# Patient Record
Sex: Female | Born: 1975
Health system: Southern US, Community
[De-identification: ages and names within clinical notes are randomized; demographics above are authoritative.]

## PROBLEM LIST (undated history)

## (undated) DIAGNOSIS — R109 Unspecified abdominal pain: Secondary | ICD-10-CM

## (undated) DIAGNOSIS — F419 Anxiety disorder, unspecified: Secondary | ICD-10-CM

## (undated) DIAGNOSIS — K219 Gastro-esophageal reflux disease without esophagitis: Secondary | ICD-10-CM

## (undated) DIAGNOSIS — Z87442 Personal history of urinary calculi: Secondary | ICD-10-CM

## (undated) HISTORY — PX: DILATION AND CURETTAGE OF UTERUS: SHX78

## (undated) HISTORY — PX: CYSTOSCOPY W/ URETEROSCOPY W/ LITHOTRIPSY: SUR380

---

## 2001-12-30 ENCOUNTER — Encounter (INDEPENDENT_AMBULATORY_CARE_PROVIDER_SITE_OTHER): Payer: Self-pay | Admitting: *Deleted

## 2001-12-30 LAB — CONVERTED CEMR LAB

## 2002-01-06 ENCOUNTER — Other Ambulatory Visit: Admission: RE | Admit: 2002-01-06 | Discharge: 2002-01-06 | Payer: Self-pay | Admitting: Gynecology

## 2002-11-14 ENCOUNTER — Encounter: Admission: RE | Admit: 2002-11-14 | Discharge: 2002-11-14 | Payer: Self-pay | Admitting: Family Medicine

## 2003-01-24 ENCOUNTER — Other Ambulatory Visit: Admission: RE | Admit: 2003-01-24 | Discharge: 2003-01-24 | Payer: Self-pay | Admitting: Gynecology

## 2003-08-13 ENCOUNTER — Encounter: Admission: RE | Admit: 2003-08-13 | Discharge: 2003-08-13 | Payer: Self-pay | Admitting: Family Medicine

## 2003-08-13 ENCOUNTER — Ambulatory Visit (HOSPITAL_COMMUNITY): Admission: RE | Admit: 2003-08-13 | Discharge: 2003-08-13 | Payer: Self-pay | Admitting: Family Medicine

## 2003-12-25 ENCOUNTER — Encounter: Admission: RE | Admit: 2003-12-25 | Discharge: 2003-12-25 | Payer: Self-pay | Admitting: Family Medicine

## 2004-08-26 ENCOUNTER — Ambulatory Visit: Payer: Self-pay | Admitting: Family Medicine

## 2004-09-15 ENCOUNTER — Ambulatory Visit (HOSPITAL_COMMUNITY): Admission: RE | Admit: 2004-09-15 | Discharge: 2004-09-15 | Payer: Self-pay | Admitting: Obstetrics & Gynecology

## 2004-10-07 ENCOUNTER — Ambulatory Visit: Payer: Self-pay | Admitting: Family Medicine

## 2005-02-03 ENCOUNTER — Other Ambulatory Visit: Admission: RE | Admit: 2005-02-03 | Discharge: 2005-02-03 | Payer: Self-pay | Admitting: Gynecology

## 2005-06-05 IMAGING — RF DG HYSTEROGRAM
6 series · 6 of 6 positions shown · IV contrast (omnipaque)
Comparison: none

CLINICAL DATA: Infertility.  Assess tubal patency.  
 HYSTEROGRAM:
 Following sterile cleansing of the external cervical os with Betadine, hysterosalpingography was performed via placement of a 5 French hysterosalpingography catheter into the endocervical canal where it was stabilized with an end catheter balloon.  Approximately 10 cc of Omnipaque 300 was injected into the endometrial canal without difficulty.
 A normal endometrial morphology was seen. Both fallopian tubes demonstrate a normal morphology and bilateral free intraperitoneal spill was documented.  On the post-drainage film no evidence for loculation of contrast is seen to suggest the presence of peritubal or periovarian adhesions.

[Series 1: run · 1 of 1 slices shown (1 of 6)]
[im 1/1]
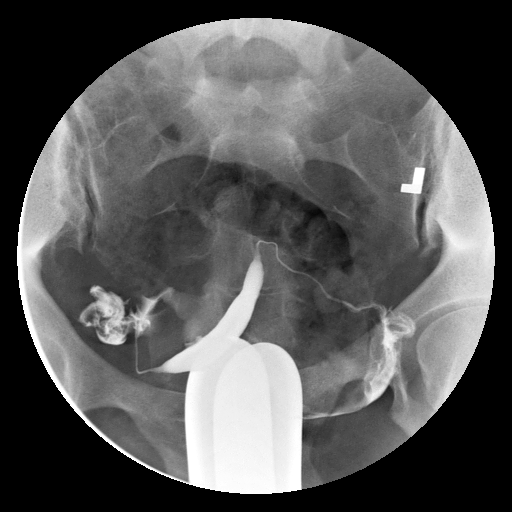

[Series 2: run · 1 of 1 slices shown (2 of 6)]
[im 1/1]
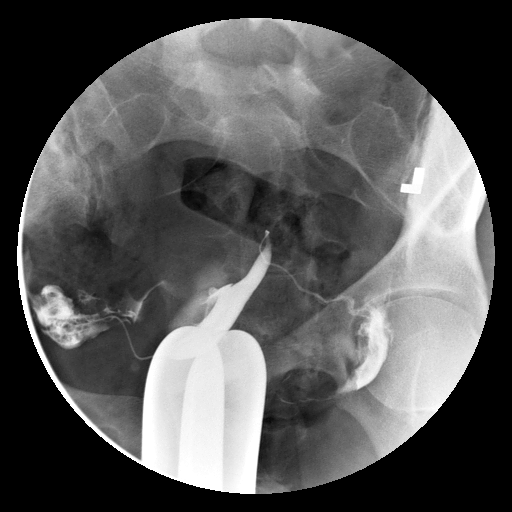

[Series 3: run · 1 of 1 slices shown (3 of 6)]
[im 1/1]
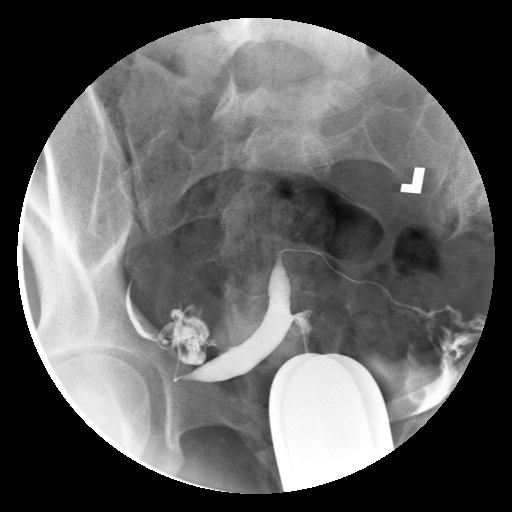

[Series 4: run · 1 of 1 slices shown (4 of 6)]
[im 1/1]
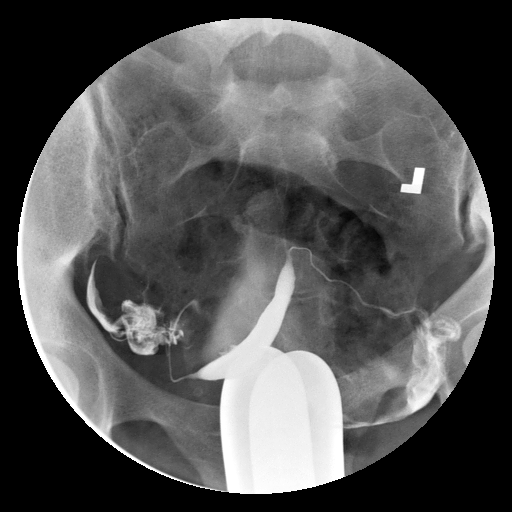

[Series 5: run · 1 of 1 slices shown (5 of 6)]
[im 1/1]
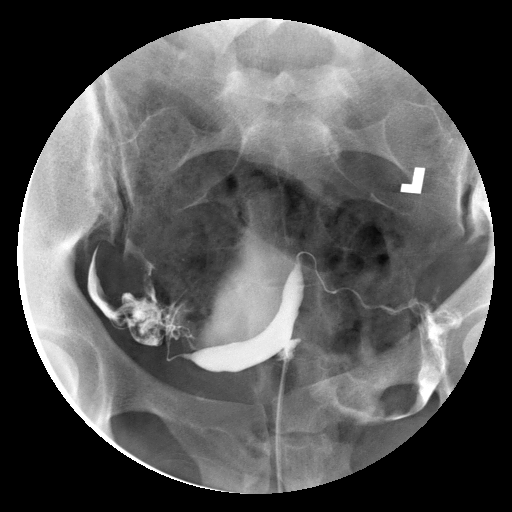

[Series 6: run · 1 of 1 slices shown (6 of 6)]
[im 1/1]
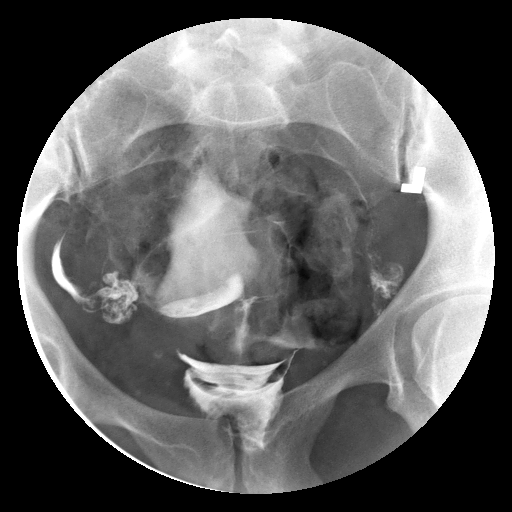

[6 of 6 positions shown; findings below may reference images not displayed]

IMPRESSION: Normal HSG.

## 2005-08-14 ENCOUNTER — Encounter (INDEPENDENT_AMBULATORY_CARE_PROVIDER_SITE_OTHER): Payer: Self-pay | Admitting: *Deleted

## 2005-08-14 ENCOUNTER — Inpatient Hospital Stay (HOSPITAL_COMMUNITY): Admission: AD | Admit: 2005-08-14 | Discharge: 2005-08-16 | Payer: Self-pay | Admitting: Gynecology

## 2005-08-17 ENCOUNTER — Encounter: Admission: RE | Admit: 2005-08-17 | Discharge: 2005-09-16 | Payer: Self-pay | Admitting: Gynecology

## 2005-09-17 ENCOUNTER — Encounter: Admission: RE | Admit: 2005-09-17 | Discharge: 2005-10-16 | Payer: Self-pay | Admitting: Gynecology

## 2005-09-24 ENCOUNTER — Other Ambulatory Visit: Admission: RE | Admit: 2005-09-24 | Discharge: 2005-09-24 | Payer: Self-pay | Admitting: Gynecology

## 2005-10-17 ENCOUNTER — Encounter: Admission: RE | Admit: 2005-10-17 | Discharge: 2005-11-16 | Payer: Self-pay | Admitting: Gynecology

## 2005-11-17 ENCOUNTER — Encounter: Admission: RE | Admit: 2005-11-17 | Discharge: 2005-12-17 | Payer: Self-pay | Admitting: Gynecology

## 2006-03-16 ENCOUNTER — Ambulatory Visit: Payer: Self-pay | Admitting: Family Medicine

## 2006-06-22 ENCOUNTER — Ambulatory Visit: Payer: Self-pay | Admitting: Family Medicine

## 2006-07-29 DIAGNOSIS — F411 Generalized anxiety disorder: Secondary | ICD-10-CM

## 2006-07-29 DIAGNOSIS — L708 Other acne: Secondary | ICD-10-CM | POA: Insufficient documentation

## 2006-07-29 DIAGNOSIS — F329 Major depressive disorder, single episode, unspecified: Secondary | ICD-10-CM

## 2006-07-29 DIAGNOSIS — F339 Major depressive disorder, recurrent, unspecified: Secondary | ICD-10-CM | POA: Insufficient documentation

## 2006-07-29 DIAGNOSIS — R000 Tachycardia, unspecified: Secondary | ICD-10-CM | POA: Insufficient documentation

## 2006-07-30 ENCOUNTER — Encounter (INDEPENDENT_AMBULATORY_CARE_PROVIDER_SITE_OTHER): Payer: Self-pay | Admitting: *Deleted

## 2006-11-12 ENCOUNTER — Telehealth: Payer: Self-pay | Admitting: Family Medicine

## 2006-12-07 ENCOUNTER — Ambulatory Visit: Payer: Self-pay | Admitting: Family Medicine

## 2006-12-07 ENCOUNTER — Encounter: Payer: Self-pay | Admitting: Family Medicine

## 2007-06-27 ENCOUNTER — Telehealth: Payer: Self-pay | Admitting: Family Medicine

## 2007-08-16 ENCOUNTER — Ambulatory Visit: Payer: Self-pay | Admitting: Family Medicine

## 2007-09-15 ENCOUNTER — Ambulatory Visit: Payer: Self-pay | Admitting: Family Medicine

## 2007-10-21 ENCOUNTER — Telehealth (INDEPENDENT_AMBULATORY_CARE_PROVIDER_SITE_OTHER): Payer: Self-pay | Admitting: Family Medicine

## 2007-11-08 ENCOUNTER — Ambulatory Visit: Payer: Self-pay | Admitting: Family Medicine

## 2007-12-01 ENCOUNTER — Telehealth: Payer: Self-pay | Admitting: *Deleted

## 2007-12-21 ENCOUNTER — Telehealth: Payer: Self-pay | Admitting: *Deleted

## 2007-12-22 ENCOUNTER — Encounter: Payer: Self-pay | Admitting: *Deleted

## 2008-02-03 ENCOUNTER — Encounter: Payer: Self-pay | Admitting: Family Medicine

## 2008-02-03 ENCOUNTER — Ambulatory Visit: Payer: Self-pay | Admitting: Family Medicine

## 2008-02-03 LAB — CONVERTED CEMR LAB
ALT: 11 units/L (ref 0–35)
AST: 13 units/L (ref 0–37)
Albumin: 4.7 g/dL (ref 3.5–5.2)
Alkaline Phosphatase: 35 units/L — ABNORMAL LOW (ref 39–117)
Cholesterol: 173 mg/dL (ref 0–200)
HCT: 42.1 % (ref 36.0–46.0)
Hemoglobin: 13.7 g/dL (ref 12.0–15.0)
MCV: 91.5 fL (ref 78.0–100.0)
Platelets: 268 10*3/uL (ref 150–400)
RBC: 4.6 M/uL (ref 3.87–5.11)
RDW: 12.8 % (ref 11.5–15.5)
Sodium: 137 meq/L (ref 135–145)
Total Bilirubin: 0.8 mg/dL (ref 0.3–1.2)
Total CHOL/HDL Ratio: 2.9
WBC: 4.4 10*3/uL (ref 4.0–10.5)

## 2008-02-24 ENCOUNTER — Encounter: Payer: Self-pay | Admitting: Family Medicine

## 2008-03-01 ENCOUNTER — Telehealth: Payer: Self-pay | Admitting: Family Medicine

## 2008-03-02 ENCOUNTER — Encounter: Payer: Self-pay | Admitting: Family Medicine

## 2008-05-04 ENCOUNTER — Ambulatory Visit: Payer: Self-pay | Admitting: Family Medicine

## 2008-06-18 ENCOUNTER — Telehealth: Payer: Self-pay | Admitting: Family Medicine

## 2008-06-29 ENCOUNTER — Ambulatory Visit: Payer: Self-pay | Admitting: Family Medicine

## 2008-07-09 ENCOUNTER — Telehealth: Payer: Self-pay | Admitting: Family Medicine

## 2008-08-07 ENCOUNTER — Ambulatory Visit: Payer: Self-pay | Admitting: Family Medicine

## 2008-08-15 ENCOUNTER — Telehealth: Payer: Self-pay | Admitting: *Deleted

## 2008-08-22 ENCOUNTER — Ambulatory Visit: Payer: Self-pay | Admitting: Family Medicine

## 2008-08-22 ENCOUNTER — Ambulatory Visit: Payer: Self-pay | Admitting: Gynecology

## 2008-08-22 ENCOUNTER — Telehealth: Payer: Self-pay | Admitting: *Deleted

## 2008-08-22 DIAGNOSIS — N920 Excessive and frequent menstruation with regular cycle: Secondary | ICD-10-CM

## 2008-08-22 LAB — CONVERTED CEMR LAB: Beta hcg, urine, semiquantitative: NEGATIVE

## 2008-09-10 ENCOUNTER — Telehealth: Payer: Self-pay | Admitting: *Deleted

## 2008-12-11 ENCOUNTER — Ambulatory Visit: Payer: Self-pay | Admitting: Family Medicine

## 2009-02-15 ENCOUNTER — Ambulatory Visit: Payer: Self-pay | Admitting: Family Medicine

## 2009-02-15 ENCOUNTER — Encounter: Payer: Self-pay | Admitting: Family Medicine

## 2009-02-15 LAB — CONVERTED CEMR LAB
HCT: 37.2 % (ref 36.0–46.0)
HDL: 58 mg/dL (ref >39)
Hemoglobin: 12.4 g/dL (ref 12.0–15.0)
LDL Cholesterol: 90 mg/dL (ref 0–99)
MCHC: 33.3 g/dL (ref 30.0–36.0)
RBC: 4.17 M/uL (ref 3.87–5.11)
RDW: 12.5 % (ref 11.5–15.5)

## 2009-02-18 ENCOUNTER — Encounter: Payer: Self-pay | Admitting: Family Medicine

## 2009-03-05 ENCOUNTER — Telehealth: Payer: Self-pay | Admitting: Family Medicine

## 2009-03-06 ENCOUNTER — Telehealth: Payer: Self-pay | Admitting: Family Medicine

## 2009-07-01 ENCOUNTER — Ambulatory Visit: Payer: Self-pay | Admitting: Family Medicine

## 2009-07-16 ENCOUNTER — Telehealth: Payer: Self-pay | Admitting: Family Medicine

## 2009-07-16 ENCOUNTER — Ambulatory Visit: Payer: Self-pay | Admitting: Family Medicine

## 2009-07-22 ENCOUNTER — Telehealth: Payer: Self-pay | Admitting: Family Medicine

## 2009-09-12 ENCOUNTER — Telehealth: Payer: Self-pay | Admitting: *Deleted

## 2009-11-28 ENCOUNTER — Encounter: Payer: Self-pay | Admitting: Family Medicine

## 2009-11-28 LAB — CONVERTED CEMR LAB
Chlamydia, DNA Probe: NEGATIVE
Hep B C IgM: NEGATIVE

## 2009-12-10 ENCOUNTER — Ambulatory Visit: Payer: Self-pay | Admitting: Family Medicine

## 2009-12-10 ENCOUNTER — Encounter: Payer: Self-pay | Admitting: Family Medicine

## 2009-12-10 DIAGNOSIS — B009 Herpesviral infection, unspecified: Secondary | ICD-10-CM

## 2009-12-10 LAB — CONVERTED CEMR LAB
Bilirubin Urine: NEGATIVE
Ketones, urine, test strip: NEGATIVE
Nitrite: NEGATIVE
Protein, U semiquant: NEGATIVE

## 2009-12-16 LAB — CONVERTED CEMR LAB
HCV Ab: NEGATIVE
Hepatitis B Surface Ag: NEGATIVE

## 2010-01-06 ENCOUNTER — Encounter: Payer: Self-pay | Admitting: Family Medicine

## 2010-01-13 ENCOUNTER — Encounter: Payer: Self-pay | Admitting: Family Medicine

## 2010-01-13 ENCOUNTER — Telehealth: Payer: Self-pay | Admitting: Family Medicine

## 2010-01-15 ENCOUNTER — Telehealth: Payer: Self-pay | Admitting: Family Medicine

## 2010-01-23 ENCOUNTER — Ambulatory Visit: Payer: Self-pay | Admitting: Family Medicine

## 2010-01-23 LAB — CONVERTED CEMR LAB: Rapid Strep: NEGATIVE

## 2010-02-07 ENCOUNTER — Telehealth: Payer: Self-pay | Admitting: Family Medicine

## 2010-04-03 ENCOUNTER — Telehealth: Payer: Self-pay | Admitting: Family Medicine

## 2010-04-14 ENCOUNTER — Encounter: Payer: Self-pay | Admitting: Family Medicine

## 2010-07-01 NOTE — Progress Notes (Signed)
Summary: phn msg  Phone Note Call from Patient Call back at Home Phone [phone]   Caller: Patient Summary of Call: needs the note to state more extensively that she cannot have a roommate Initial call taken by: De Nurse,  January 15, 2010 3:53 PM  Follow-up for Phone Call        to pcp. pt is unable to say what we should write but her company is not going to give her a private room unless the letter is more specific with what the problem is and why she must have a private room. told her will send to pcp but unlikely we will have this by 5 today. pt states she has to have this by 5. sent to pcp marked urgent Follow-up by: Golden Circle RN,  January 15, 2010 4:07 PM  Additional Follow-up for Phone Call Additional follow up Details #1::        I called pt. this day and spoke with her.  It is not necessary to outline exact nature of problem.  She was going to re-visit with HR. Additional Follow-up by: Tinnie Gens MD,  January 16, 2010 10:37 AM

## 2010-07-01 NOTE — Assessment & Plan Note (Signed)
Summary: achy,nausea/Tanya Webb/Tanya Webb   Vital Signs:  Patient profile:   35 year old female Height:      66 inches Weight:      136.8 pounds BMI:     22.16 Temp:     98.7 degrees F oral Pulse rate:   96 / minute BP sitting:   113 / 79  (left arm) Cuff size:   regular  Vitals Entered By: Garen Grams LPN (July 16, 2009 9:45 AM) CC: fatigue, nausea, dizzy x 3 days Is Patient Diabetic? No Pain Assessment Patient in pain? no        CC:  fatigue, nausea, and dizzy x 3 days.  History of Present Illness: Threw up once on Sat. pm.  No diarrhea but nausea, fatigue and dzziness come in waves.  On nothing for birth control.  Needed IUI to get pregnant last time.   Does not feel right. LMP 3 wks ago.  Negative UPT on last evening.  Next cycle due in 5 days.  Habits & Providers  Alcohol-Tobacco-Diet     Tobacco Status: never  Current Problems (verified): 1)  Nausea  (ICD-787.02) 2)  Screening Other&unspec Cardiovascular Conditions  (ICD-V81.2) 3)  Menorrhagia  (ICD-626.2) 4)  Preventive Health Care  (ICD-V70.0) 5)  Gynecologicical Exam  (ICD-V72.31) 6)  Screening For Malignant Neoplasm, Cervix  (ICD-V76.2) 7)  Tachycardia  (ICD-785.0) 8)  Depressive Disorder, Nos  (ICD-311) 9)  Anxiety  (ICD-300.00) 10)  Acne  (ICD-706.1)  Current Medications (verified): 1)  Ativan 0.5 Mg Tabs (Lorazepam) .... One Three Times A Day As Needed For Anxiety 2)  Citalopram Hydrobromide 20 Mg Tabs (Citalopram Hydrobromide) .... 1/2-1 Tab Daily  Allergies (verified): No Known Drug Allergies  Past History:  Family History: Last updated: 12-Aug-2006 COPD-Mom deceased age 40, DM, CAD-Dad deceased age 48, DM-brother  Social History: Last updated: 12-Aug-2006 Works for [employer]; No tob, alcohol, drugs; Married-1 child  Risk Factors: Alcohol Use: 0 (02/15/2009)  Risk Factors: Smoking Status: never (07/16/2009)  Review of Systems  The patient denies anorexia, weight loss, weight gain, chest  pain, syncope, peripheral edema, prolonged cough, headaches, abdominal pain, hematochezia, and severe indigestion/heartburn.    Physical Exam  General:  alert, well-developed, and well-nourished.   Head:  normocephalic and atraumatic.   Neck:  supple.   Lungs:  normal respiratory effort.   Heart:  normal rate.   Abdomen:  soft and non-tender.     Impression & Recommendations:  Problem # 1:  NAUSEA (ICD-787.02)  Orders: U Preg-[facility] (81025) [facility]- Est Level  3 ([address]109)  Problem # 2:  GENERAL COUNSELING PRESCRIPTION ORAL CONTRACEPTS (ICD-V25.01) Re-start Yaz after cycle next week.  If no period--repeat upt. Orders: [facility]- Est Level  3 ([address]454)  Complete Medication List: 1)  Ativan 0.5 Mg Tabs (Lorazepam) .... One three times a day as needed for anxiety 2)  Citalopram Hydrobromide 20 Mg Tabs (Citalopram hydrobromide) .... 1/2-1 tab daily 3)  Yaz 3-0.02 Mg Tabs (Drospirenone-ethinyl estradiol) .Marland Kitchen.. 1 by mouth daily  Patient Instructions: 1)  Please schedule a follow-up appointment as needed .  Prescriptions: YAZ 3-0.02 MG TABS (DROSPIRENONE-ETHINYL ESTRADIOL) 1 by mouth daily  #1 pack x 11   Entered and Authorized by:   Tinnie Gens MD   Signed by:   Tinnie Gens MD on 07/16/2009   Method used:   Electronically to        [facility] (retail)       [address]  [address]       [place], [place]  [address]       Ph: [phone]       Fax: [phone]   RxID:   [id number]

## 2010-07-01 NOTE — Progress Notes (Signed)
Summary: needs note  Phone Note Call from Patient Call back at Home Phone [phone]   Caller: Patient Summary of Call: needs a note stating she has a condition that she cannot share a hotel room -  fax to [phone]  Initial call taken by: De Nurse,  September 12, 2009 12:31 PM  Follow-up for Phone Call        Note written for pt. Follow-up by: Tinnie Gens MD,  September 12, 2009 3:46 PM  Additional Follow-up for Phone Call Additional follow up Details #1::        letter faxed. Additional Follow-up by: Garen Grams LPN,  September 12, 2009 5:11 PM

## 2010-07-01 NOTE — Progress Notes (Signed)
Summary: triage  Phone Note Call from Patient Call back at Home Phone [phone]   Caller: Patient Summary of Call: achy/nausea/doesn't feel good - wants to come in today Initial call taken by: De Nurse,  July 16, 2009 8:33 AM  Follow-up for Phone Call        started saturday night. took alka seltzer & ibu. double booked with her pcp. is coming from [place]. told her there may be a wait Follow-up by: Golden Circle RN,  July 16, 2009 8:35 AM

## 2010-07-01 NOTE — Letter (Signed)
Summary: Out of Work  [facility]  [address]   [place], [place] [address]   Phone: [phone]  Fax: [phone]    January 13, 2010   Patient:  TAMAIRA, CIRIELLO DOB: [birth year]   To Whom It May Concern:   This letter is to attest that Shemaiah Round is an established patient in our practice.  It is recommended that she be permitted to occupy a single-occupancy room when traveling on business due to a sleep disorder.    If you need additional information, please feel free to contact our office.         Sincerely,    Paula Compton MD

## 2010-07-01 NOTE — Progress Notes (Signed)
Summary: phn msg  Phone Note Call from Patient Call back at Home Phone [phone]   Caller: Patient Summary of Call: needs a doctors note stating about sleep disorder - needs updating and also needs dx needs asap - pls fax to [phone] Initial call taken by: De Nurse,  January 13, 2010 2:27 PM  Follow-up for Phone Call        told her pcp is not here. she has to have it now as they are making arrangments for an upcoming biz trip. she is a very light sleeper and if she has to share a room, she will wake up & have difficulty getting back to sleep. told her I will ask if another md will review her chart & agree to write the note Follow-up by: Golden Circle RN,  January 13, 2010 2:30 PM  Additional Follow-up for Phone Call Additional follow up Details #1::        pt notified that I am faxing the letter Additional Follow-up by: Golden Circle RN,  January 13, 2010 2:56 PM

## 2010-07-01 NOTE — Assessment & Plan Note (Signed)
Summary: sore throat,tcb   Vital Signs:  Patient profile:   35 year old female Height:      66 inches Weight:      145 pounds BMI:     23.49 BSA:     1.75 Temp:     98.9 degrees F Pulse rate:   98 / minute BP sitting:   125 / 82  Vitals Entered By: Jone Baseman [name] (January 23, 2010 1:50 PM) CC: sore throat x 2 weeks, URI symptoms Is Patient Diabetic? No Pain Assessment Patient in pain? yes     Location: throat Intensity: 8   Primary Care Provider:  Tinnie Gens MD  CC:  sore throat x 2 weeks and URI symptoms.  History of Present Illness:       This is a 35 year old woman who presents with URI symptoms.  The patient complains of nasal congestion, sore throat, and dry cough, but denies clear nasal discharge, purulent nasal discharge, earache, and sick contacts.  Associated symptoms include fever.  The patient denies fever to >104 degrees, stiff neck, dyspnea, and wheezing.  The patient denies itchy watery eyes, itchy throat, sneezing, seasonal symptoms, and response to antihistamine.  The patient denies the following risk factors for Strep sinusitis: unilateral facial pain, unilateral nasal discharge, double sickening, tooth pain, Strep exposure, and tender adenopathy.    Habits & Providers  Alcohol-Tobacco-Diet     Tobacco Status: never  Current Problems (verified): 1)  Sore Throat  (ICD-462) 2)  Hsv  (ICD-054.9) 3)  Contact With or Exposure To Venereal Diseases  (ICD-V01.6) 4)  Urinary Frequency  (ICD-788.41) 5)  General Counseling Prescription Oral Contracepts  (ICD-V25.01) 6)  Nausea  (ICD-787.02) 7)  Screening Other&unspec Cardiovascular Conditions  (ICD-V81.2) 8)  Menorrhagia  (ICD-626.2) 9)  Preventive Health Care  (ICD-V70.0) 10)  Gynecologicical Exam  (ICD-V72.31) 11)  Screening For Malignant Neoplasm, Cervix  (ICD-V76.2) 12)  Tachycardia  (ICD-785.0) 13)  Depressive Disorder, Nos  (ICD-311) 14)  Anxiety  (ICD-300.00) 15)  Acne  (ICD-706.1)  Current  Medications (verified): 1)  Ativan 0.5 Mg Tabs (Lorazepam) .... One Three Times A Day As Needed For Anxiety 2)  Citalopram Hydrobromide 20 Mg Tabs (Citalopram Hydrobromide) .... 1/2-1 Tab Daily 3)  Fluticasone Propionate 50 Mcg/act Susp (Fluticasone Propionate) .... 2 Sprays Ea. Nostril Daily 4)  Tetracycline Hcl 250 Mg Caps (Tetracycline Hcl) .Marland Kitchen.. 1 By Mouth Two Times A Day As Needed  Allergies (verified): No Known Drug Allergies  Past History:  Past medical, surgical, family and social histories (including risk factors) reviewed, and no changes noted (except as noted below).  Family History: Reviewed history from 07/29/2006 and no changes required. COPD-Mom deceased age 18, DM, CAD-Dad deceased age 54, DM-brother  Social History: Reviewed history from 07/29/2006 and no changes required. Works for [employer]; No tob, alcohol, drugs; Married-1 child  Review of Systems  The patient denies anorexia, weight loss, weight gain, hoarseness, chest pain, syncope, dyspnea on exertion, peripheral edema, headaches, abdominal pain, and severe indigestion/heartburn.    Physical Exam  General:  alert, well-developed, and well-nourished.   Head:  normocephalic and atraumatic.   Ears:  R ear normal and L ear normal.   Nose:  External nasal examination shows no deformity or inflammation, .mucosal erythema.   Mouth:  good dentition, pharyngeal erythema, and postnasal drip.     Impression & Recommendations:  Problem # 1:  SORE THROAT (ICD-462) Strep negative-likely URI-Trial of Flonase, warm salt water gargles. Orders: Rapid Strep-[facility] ([address])  Problem # 2:  ACNE (ICD-706.1)  Her updated medication list for this problem includes:    Tetracycline Hcl 250 Mg Caps (Tetracycline hcl) .Marland Kitchen... 1 by mouth two times a day as needed  Complete Medication List: 1)  Ativan 0.5 Mg Tabs (Lorazepam) .... One three times a day as needed for anxiety 2)  Citalopram Hydrobromide 20 Mg Tabs (Citalopram  hydrobromide) .... 1/2-1 tab daily 3)  Fluticasone Propionate 50 Mcg/act Susp (Fluticasone propionate) .... 2 sprays ea. nostril daily 4)  Tetracycline Hcl 250 Mg Caps (Tetracycline hcl) .Marland Kitchen.. 1 by mouth two times a day as needed  Patient Instructions: 1)  Please schedule a follow-up appointment in 6 months .  Prescriptions: TETRACYCLINE HCL 250 MG CAPS (TETRACYCLINE HCL) 1 by mouth two times a day as needed  #30 x 2   Entered and Authorized by:   Tinnie Gens MD   Signed by:   Tinnie Gens MD on 01/23/2010   Method used:   Electronically to        [facility] (retail)       [address]       [place], [place]  [address]       Ph: [phone]       Fax: [phone]   RxID:   [id number] FLUTICASONE PROPIONATE 50 MCG/ACT SUSP (FLUTICASONE PROPIONATE) 2 sprays ea. nostril daily  #1 x 2   Entered and Authorized by:   Tinnie Gens MD   Signed by:   Tinnie Gens MD on 01/23/2010   Method used:   Electronically to        [facility] (retail)       [address]       [place], [place]  [address]       Ph: [phone]       Fax: [phone]   RxID:   [id number]   Laboratory Results  Date/Time Received: January 23, 2010 1:50 PM  Date/Time Reported: January 23, 2010 1:58 PM   Other Tests  Rapid Strep: negative Comments: ...........test performed by...........Marland KitchenTerese Door, [name]     Appended Document: sore throat,tcb    Clinical Lists Changes  Problems: Added new problem of SORE THROAT (ICD-462) Orders: Added new Test order of [facility]- Est Level  3 ([address]) - Signed

## 2010-07-01 NOTE — Miscellaneous (Signed)
  Clinical Lists Changes  Problems: Removed problem of CONTACT WITH OR EXPOSURE TO VENEREAL DISEASES (ICD-V01.6) Removed problem of URINARY FREQUENCY (ICD-788.41) Removed problem of NAUSEA (ICD-787.02) Removed problem of SCREENING OTHER&UNSPEC CARDIOVASCULAR CONDITIONS (ICD-V81.2) Removed problem of GYNECOLOGICICAL EXAM (ICD-V72.31) Removed problem of SCREENING FOR MALIGNANT NEOPLASM, CERVIX (ICD-V76.2) Removed problem of PREVENTIVE HEALTH CARE (ICD-V70.0) Removed problem of GENERAL COUNSELING PRESCRIPTION ORAL CONTRACEPTS (ICD-V25.01)

## 2010-07-01 NOTE — Progress Notes (Signed)
Summary: triage  Phone Note Call from Patient Call back at Home Phone [phone]   Caller: Patient Summary of Call: Pt wondering if she can have an antibotic called in for an ear infection? Initial call taken by: Clydell Hakim,  July 22, 2009 10:28 AM  Follow-up for Phone Call        [place] [employer]. states she has an ear infection. adamant that she does not want to come back in. stated she was just here. explained it was not for ear & we usually must see a pt before md decides about need for antibiotic. pcp not here. asked that I ask S. Saxon about this. told her I will & will cal back with response. Follow-up by: Golden Circle RN,  July 22, 2009 11:07 AM  Additional Follow-up for Phone Call Additional follow up Details #1::        told her S. Humberto Seals also declined to give meds without a visit. stated it is our policy. told pt this again. she will e here at 1:30 for a work in. aware there may be a wait Additional Follow-up by: Golden Circle RN,  July 22, 2009 11:52 AM

## 2010-07-01 NOTE — Progress Notes (Signed)
Summary: meds prob  Phone Note Call from Patient Call back at Home Phone [phone]   Caller: Patient Summary of Call: was switched to Tetracyclen and it is not working - would like to change to [employer].  Initial call taken by: De Nurse,  April 03, 2010 3:56 PM    New/Updated Medications: MINOCYCLINE HCL 100 MG CAPS (MINOCYCLINE HCL) 1 by mouth daily Prescriptions: MINOCYCLINE HCL 100 MG CAPS (MINOCYCLINE HCL) 1 by mouth daily  #30 x 3   Entered and Authorized by:   Tinnie Gens MD   Signed by:   Tinnie Gens MD on 04/03/2010   Method used:   Electronically to        [facility] (retail)       [address]       [place], [place]  [address]       Ph: [phone]       Fax: [phone]   RxID:   [id number]

## 2010-07-01 NOTE — Assessment & Plan Note (Signed)
Summary: f/up for meds,tcb   Vital Signs:  Patient profile:   35 year old female Height:      66 inches Weight:      141 pounds BMI:     22.84 Temp:     98.6 degrees F oral Pulse rate:   97 / minute BP sitting:   112 / 77  (left arm) Cuff size:   regular  Vitals Entered By: Tessie Fass, [name] (July 01, 2009 3:28 PM) CC: F/U meds Is Patient Diabetic? No Pain Assessment Patient in pain? no        CC:  F/U meds.  History of Present Illness: Describes chronic anxiety, and pre menstral dysphoric syndrome behaviors.  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  She exercises often.  Has used SSRI medications in the past and Lexapro made her sleepy.  She often has difficulty sleeping as she falls asleep but awakens and thinks about all her struggles.  Lorazepam has been effective, last year she used a total of #60, and would like a refill.  She is very careful.  Habits & Providers  Alcohol-Tobacco-Diet     Tobacco Status: never  Current Medications (verified): 1)  Ativan 0.5 Mg Tabs (Lorazepam) .... One Three Times A Day As Needed For Anxiety 2)  Citalopram Hydrobromide 20 Mg Tabs (Citalopram Hydrobromide) .... 1/2-1 Tab Daily  Allergies (verified): No Known Drug Allergies  Review of Systems Psych:  Complains of anxiety, depression, irritability, and panic attacks; denies suicidal thoughts/plans.  Physical Exam  General:  Well-developed,well-nourished,in no acute distress; alert,appropriate and cooperative throughout examination Psych:  Oriented X3, memory intact for recent and remote, normally interactive, not depressed appearing, and slightly anxious.     Impression & Recommendations:  Problem # 1:  DEPRESSIVE DISORDER, NOS (ICD-311) Refilled lorazepam, added low dose SSRI to use as in and around her menses for one week, may use ongoing as well, she is insightful enough to use  appropriately. Her updated medication list for this problem includes:    Ativan 0.5 Mg Tabs (Lorazepam) ..... One three times a day as needed for anxiety    Citalopram Hydrobromide 20 Mg Tabs (Citalopram hydrobromide) .Marland Kitchen... 1/2-1 tab daily  Orders: [facility]- Est Level  2 ([address])  Complete Medication List: 1)  Ativan 0.5 Mg Tabs (Lorazepam) .... One three times a day as needed for anxiety 2)  Citalopram Hydrobromide 20 Mg Tabs (Citalopram hydrobromide) .... 1/2-1 tab daily  Patient Instructions: 1)  May begin 1/2 tab of citalopram as soon as you feel the anxiety,  may stop mid cycle 2)  Ativan prn Prescriptions: CITALOPRAM HYDROBROMIDE 20 MG TABS (CITALOPRAM HYDROBROMIDE) 1/2-1 tab daily Brand medically necessary #30 x 6   Entered and Authorized by:   Luretha Murphy NP   Signed by:   Luretha Murphy NP on 07/01/2009   Method used:   Print then Give to Patient   RxID:   [id number] ATIVAN 0.5 MG TABS (LORAZEPAM) one three times a day as needed for anxiety Brand medically necessary #60 x 0   Entered and Authorized by:   Luretha Murphy NP   Signed by:   Luretha Murphy NP on 07/01/2009   Method used:   Print then Give to Patient   RxID:   [id number]    Prevention & Chronic Care Immunizations   Influenza vaccine: Not documented    Tetanus booster: 02/03/2008: given   Tetanus  booster due: 02/02/2018    Pneumococcal vaccine: Not documented  Other Screening   Pap smear: NEGATIVE FOR INTRAEPITHELIAL LESIONS OR MALIGNANCY.  (02/03/2008)   Pap smear action/deferral: Deferred-2 yr interval  (02/15/2009)   Pap smear due: 12/31/2002   Smoking status: never  (07/01/2009)  Lipids   Total Cholesterol: 163  (02/15/2009)   LDL: 90  (02/15/2009)   LDL Direct: Not documented   HDL: 58  (02/15/2009)   Triglycerides: 77  (02/15/2009)

## 2010-07-01 NOTE — Assessment & Plan Note (Signed)
Summary: f/u,df   Vital Signs:  Patient profile:   35 year old female Height:      66 inches Weight:      139.5 pounds BMI:     22.60 Temp:     98.7 degrees F oral Pulse rate:   102 / minute BP sitting:   132 / 89  (left arm) Cuff size:   regular  Vitals Entered By: Gladstone Pih (December 10, 2009 9:38 AM) CC: f/u Is Patient Diabetic? No Pain Assessment Patient in pain? no        Primary Care Provider:  Tinnie Gens MD  CC:  f/u.  History of Present Illness: 35 yo here for evaluation of vaginal discomfort  STD exposure: patient very worried as she recently found out about husband's infidelity.  She is  unsure if she is just anxious or is actually feeling discomfort in her vaginal area.  Has not noticed any lesions.  No vaginal discharge, no dysuria but having  some urinary frequency.  Went to another clinic to get STD testing as she was embarrased to come here.  States that HIV, Syphillis, gonorrhea, and chlamydia tests.  Did not do speculum exam.  She is worried because blood test for HSV as positive.  Has never had an outbreak that she knows of.  Anxiety:  Has been stable on same dose of celexa for many years.  She feels like she has good support and does not need additional help with coping.  Asks for refill of Celexa today.  Allergies: No Known Drug Allergies  Review of Systems      See HPI  Physical Exam  General:  alert, well-developed, and well-nourished. Anxious appearing.  Genitalia:  a single shallow ulcer noted at 6 o clock position at base of perineum.  tender to palpation.   Impression & Recommendations:  Problem # 1:  HSV (ICD-054.9)  positive but no records to indiciate if HSV1 or 2.  Suspicious lesion cultured.  Unclear dx by physicl exam.  ddx to include hsv vs chancroid.  WIll await culture results and discuss tx options with patient.  Not painful to warrant empiric treatment- not sexually active at this time (no transmission)  Orders: [facility]- Est  Level  3 (99213)  Problem # 2:  CONTACT WITH OR EXPOSURE TO VENEREAL DISEASES (ICD-V01.6) WIll complete workup with hepatitis B and C.  Orders: Hep Bs Ab-[facility] ([id number]) Hep Bs Ag-[facility] [phone]) Hep C Ab-[facility] ([id number]) Wet Prep- [facility] (66063) Herpes Culture- [facility] (01601) [facility]- Est Level  3 (09323)  Complete Medication List: 1)  Ativan 0.5 Mg Tabs (Lorazepam) .... One three times a day as needed for anxiety 2)  Citalopram Hydrobromide 20 Mg Tabs (Citalopram hydrobromide) .... 1/2-1 tab daily 3)  Yaz 3-0.02 Mg Tabs (Drospirenone-ethinyl estradiol) .Marland Kitchen.. 1 by mouth daily  Other Orders: Urinalysis-[facility] (00000) Prescriptions: CITALOPRAM HYDROBROMIDE 20 MG TABS (CITALOPRAM HYDROBROMIDE) 1/2-1 tab daily Brand medically necessary #30 x 6   Entered and Authorized by:   Delbert Harness MD   Signed by:   Delbert Harness MD on 12/10/2009   Method used:   Electronically to        [facility] [address] (retail)       [address]       [place], [place]  [address]       Ph: [phone]       Fax: [phone]   RxID:   [id number]  Laboratory Results   Urine Tests  Date/Time Received: December 10, 2009 9:54 AM  Date/Time Reported: December 10, 2009 10:38 AM   Routine Urinalysis   Color: yellow Appearance: Clear Glucose: negative   (Normal Range: Negative) Bilirubin: negative   (Normal Range: Negative) Ketone: negative   (Normal Range: Negative) Spec. Gravity: 1.020   (Normal Range: 1.003-1.035) Blood: negative   (Normal Range: Negative) pH: 7.0   (Normal Range: 5.0-8.0) Protein: negative   (Normal Range: Negative) Urobilinogen: 0.2   (Normal Range: 0-1) Nitrite: negative   (Normal Range: Negative) Leukocyte Esterace: trace   (Normal Range: Negative)  Urine Microscopic WBC/HPF: 0-3 RBC/HPF: rare Bacteria/HPF: 2+ Mucous/HPF: 1+ Epithelial/HPF: 5-10 Other: small amorphous    Comments: ...........test performed by...........Marland KitchenTerese Door,  [name]  Date/Time Received: December 10, 2009 10:30 AM  Date/Time Reported: December 10, 2009 10:51 AM   [facility] Source: vaginal WBC/hpf: 1-5 Bacteria/hpf: 3+  Rods Clue cells/hpf: none  Negative whiff Yeast/hpf: none Trichomonas/hpf: none Comments: ...........test performed by...........Marland KitchenTerese Door, [name]

## 2010-07-01 NOTE — Progress Notes (Signed)
  Phone Note Call from Patient   Caller: Patient Call For: [phone] Summary of Call: Still congested and sorethroat from 2wks ago.  Require an antiobiotic or something else from what was prescribed.  Was seen on 8/25. Initial call taken by: Britta Mccreedy mcgregor  Follow-up for Phone Call        Called pt. and LVM about rx and returning for another office visit if no improvement in a couple of days. Follow-up by: Tinnie Gens MD,  February 10, 2010 9:04 AM    New/Updated Medications: ALLEGRA ALLERGY 180 MG TABS (FEXOFENADINE HCL) 1 by mouth daily Prescriptions: ALLEGRA ALLERGY 180 MG TABS (FEXOFENADINE HCL) 1 by mouth daily  #20 x 1   Entered and Authorized by:   Tinnie Gens MD   Signed by:   Tinnie Gens MD on 02/10/2010   Method used:   Electronically to        [facility] [address] (retail)       [address]       [place], [place]  [address]       Ph: [phone]       Fax: [phone]   RxID:   [id number]

## 2010-07-01 NOTE — Miscellaneous (Signed)
  Clinical Lists Changes  Observations: Added new observation of HERPES_POS: I and II IgG 50.2 (11/28/2009 14:34) Added new observation of HIV AB: Non-reactive (11/28/2009 14:34) Added new observation of HEP C AB: Negative (11/28/2009 14:34) Added new observation of HEP B C AB: negative (11/28/2009 14:34) Added new observation of HBSAG: negative (11/28/2009 14:34) Added new observation of RPR TITER: non-reactive (11/28/2009 14:34) Added new observation of CHLAMYD DNA: negative (11/28/2009 14:34) Added new observation of GC DNA PROBE: negative (11/28/2009 14:34)

## 2010-07-08 ENCOUNTER — Encounter: Payer: Self-pay | Admitting: *Deleted

## 2010-09-17 ENCOUNTER — Other Ambulatory Visit: Payer: Self-pay | Admitting: Family Medicine

## 2010-09-17 NOTE — Telephone Encounter (Signed)
Refill request

## 2010-11-20 ENCOUNTER — Encounter: Payer: Self-pay | Admitting: Family Medicine

## 2011-03-11 ENCOUNTER — Other Ambulatory Visit: Payer: Self-pay | Admitting: Family Medicine

## 2012-02-12 ENCOUNTER — Ambulatory Visit (INDEPENDENT_AMBULATORY_CARE_PROVIDER_SITE_OTHER): Payer: Managed Care, Other (non HMO) | Admitting: Family Medicine

## 2012-02-12 VITALS — BP 114/78 | HR 92 | Temp 98.6°F | Wt 156.0 lb

## 2012-02-12 DIAGNOSIS — IMO0002 Reserved for concepts with insufficient information to code with codable children: Secondary | ICD-10-CM

## 2012-02-12 DIAGNOSIS — S39011A Strain of muscle, fascia and tendon of abdomen, initial encounter: Secondary | ICD-10-CM

## 2012-02-12 NOTE — Progress Notes (Signed)
  Subjective:    Patient ID: Tanya Webb, female    DOB: [birth year], 36 y.o.   MRN: [id number]  HPI  Tanya Webb comes in for a knot just above her belly button that she noticed this morning.  She says it is painful.  She cannot think of any injury or change in activity.  She has had no surgeries on her abdomen.  No fevers/chills, nausea/vomiting.   Review of Systems See HPI    Objective:   Physical Exam BP 114/78  Pulse 92  Temp 98.6 F (37 C) (Oral)  Wt 156 lb (70.761 kg)  LMP 01/18/2012 General appearance: alert, cooperative and no distress Abdomen: +BS, soft, non-tender, except just above umbilicus there is a 2x3 cm palpable mass that is tender.  There is no abdominal wall defect.  Hurts worse with engaging abdominal muscles.        Assessment & Plan:

## 2012-02-12 NOTE — Assessment & Plan Note (Signed)
Reassured patient that I do not think this is anything dangerous- gave pt instructions for abdominal wall muscle strain.  Red flags for return to care reviewed.

## 2012-02-12 NOTE — Patient Instructions (Signed)
I think you have an abdominal wall muscle strain.  Try ibuprofen, 600 mg three times a day for about a week, then use as needed.  Also, you can ice the area, or try a heating pad, whichever feels better.   If you develop fever, nausea/vomiting, diarrhea, or worsening abdominal pain, please call the office or seek medical care.

## 2012-06-08 ENCOUNTER — Ambulatory Visit (INDEPENDENT_AMBULATORY_CARE_PROVIDER_SITE_OTHER): Payer: BC Managed Care – PPO | Admitting: Family Medicine

## 2012-06-08 ENCOUNTER — Encounter: Payer: Self-pay | Admitting: Family Medicine

## 2012-06-08 VITALS — BP 121/79 | HR 82 | Temp 98.6°F | Wt 155.0 lb

## 2012-06-08 DIAGNOSIS — J101 Influenza due to other identified influenza virus with other respiratory manifestations: Secondary | ICD-10-CM | POA: Insufficient documentation

## 2012-06-08 DIAGNOSIS — Z319 Encounter for procreative management, unspecified: Secondary | ICD-10-CM | POA: Insufficient documentation

## 2012-06-08 NOTE — Patient Instructions (Addendum)
Influenza A (H1N1) H1N1 formerly called "swine flu" is a new influenza virus causing sickness in people. The H1N1 virus is different from seasonal influenza viruses. However, the H1N1 symptoms are similar to seasonal influenza and it is spread from person to person. You may be at higher risk for serious problems if you have underlying serious medical conditions. The CDC and the World Health Organization are following reported cases around the world. CAUSES   The flu is thought to spread mainly person-to-person through coughing or sneezing of infected people.  A person may become infected by touching something with the virus on it and then touching their mouth or nose. SYMPTOMS   Fever.  Headache.  Tiredness.  Cough.  Sore throat.  Runny or stuffy nose.  Body aches.  Diarrhea and vomiting These symptoms are referred to as "flu-like symptoms." A lot of different illnesses, including the common cold, may have similar symptoms. DIAGNOSIS   There are tests that can tell if you have the H1N1 virus.  Confirmed cases of H1N1 will be reported to the state or local health department.  A doctor's exam may be needed to tell whether you have an infection that is a complication of the flu. HOME CARE INSTRUCTIONS   Stay informed. Visit the CDC website for current recommendations. Visit www.cdc.gov/H1N1flu/. You may also call 1-800-CDC-INFO (1-800-232-4636).  Get help early if you develop any of the above symptoms.  If you are at high risk from complications of the flu, talk to your caregiver as soon as you develop flu-like symptoms. Those at higher risk for complications include:  People 65 years or older.  People with chronic medical conditions.  Pregnant women.  Young children.  Your caregiver may recommend antiviral medicine to help treat the flu.  If you get the flu, get plenty of rest, drink enough water and fluids to keep your urine clear or pale yellow, and avoid using  alcohol or tobacco.  You may take over-the-counter medicine to relieve the symptoms of the flu if your caregiver approves. (Never give aspirin to children or teenagers who have flu-like symptoms, particularly fever). TREATMENT  If you do get sick, antiviral drugs are available. These drugs can make your illness milder and make you feel better faster. Treatment should start soon after illness starts. It is only effective if taken within the first day of becoming ill. Only your caregiver can prescribe antiviral medication.  PREVENTION   Cover your nose and mouth with a tissue or your arm when you cough or sneeze. Throw the tissue away.  Wash your hands often with soap and warm water, especially after you cough or sneeze. Alcohol-based cleaners are also effective against germs.  Avoid touching your eyes, nose or mouth. This is one way germs spread.  Try to avoid contact with sick people. Follow public health advice regarding school closures. Avoid crowds.  Stay home if you get sick. Limit contact with others to keep from infecting them. People infected with the H1N1 virus may be able to infect others anywhere from 1 day before feeling sick to 5-7 days after getting flu symptoms.  An H1N1 vaccine is available to help protect against the virus. In addition to the H1N1 vaccine, you will need to be vaccinated for seasonal influenza. The H1N1 and seasonal vaccines may be given on the same day. The CDC especially recommends the H1N1 vaccine for:  Pregnant women.  People who live with or care for children younger than 6 months of age.    Health care and emergency services personnel.  Persons between the ages of 62 months through 56 years of age.  People from ages 32 through 32 years who are at higher risk for H1N1 because of chronic health disorders or immune system problems. FACEMASKS In community and home settings, the use of facemasks and N95 respirators are not normally recommended. In certain  circumstances, a facemask or N95 respirator may be used for persons at increased risk of severe illness from influenza. Your caregiver can give additional recommendations for facemask use. IN CHILDREN, EMERGENCY WARNING SIGNS THAT NEED URGENT MEDICAL CARE:  Fast breathing or trouble breathing.  Bluish skin color.  Not drinking enough fluids.  Not waking up or not interacting normally.  Being so fussy that the child does not want to be held.  Your child has an oral temperature above 102 F (38.9 C), not controlled by medicine.  Your baby is older than 3 months with a rectal temperature of 102 F (38.9 C) or higher.  Your baby is 21 months old or younger with a rectal temperature of 100.4 F (38 C) or higher.  Flu-like symptoms improve but then return with fever and worse cough. IN ADULTS, EMERGENCY WARNING SIGNS THAT NEED URGENT MEDICAL CARE:  Difficulty breathing or shortness of breath.  Pain or pressure in the chest or abdomen.  Sudden dizziness.  Confusion.  Severe or persistent vomiting.  Bluish color.  You have a oral temperature above 102 F (38.9 C), not controlled by medicine.  Flu-like symptoms improve but return with fever and worse cough. SEEK IMMEDIATE MEDICAL CARE IF:  You or someone you know is experiencing any of the above symptoms. When you arrive at the emergency center, report that you think you have the flu. You may be asked to wear a mask and/or sit in a secluded area to protect others from getting sick. MAKE SURE YOU:   Understand these instructions.  Will watch your condition.  Will get help right away if you are not doing well or get worse. Some of this information courtesy of the CDC.  Document Released: 11/04/2007 Document Revised: 08/10/2011 Document Reviewed: 11/04/2007 [facility] Patient Information 2013 [place], [place]. Preparing for Pregnancy Preparing for pregnancy (preconceptual care) by getting counseling and information from your  caregiver before getting pregnant is a good idea. It will help you and your baby have a better chance to have a healthy, safe pregnancy and delivery of your baby. Make an appointment with your caregiver to talk about your health, medical, and family history and how to prepare yourself before getting pregnant. Your caregiver will do a complete physical exam and a Pap test. They will want to know:  About you, your spouse or partner, and your family's medical and genetic history.  If you are eating a balanced diet and drinking enough fluids.  What vitamins and mineral supplements you are taking. This includes taking folic acid before getting pregnant to help prevent birth defects.  What medications you are taking including prescription, over-the-counter and herbal medications.  If there is any substance abuse like alcohol, smoking, and illegal drugs.  If there is any mental or physical domestic violence.  If there is any risk of sexually transmitted disease between you and your partner.  What immunizations and vaccinations you have had and what you may need before getting pregnant.  If you should get tested for HIV infection.  If there is any exposure to chemical or toxic substances at home or work.  If  there are medical problems you have that need to be treated and kept under control before getting pregnant such as diabetes, high blood pressure or others.  If there were any past surgeries, pregnancies and problems with them.  What your current weight is and to set a goal as to how much weight you should gain while pregnant. Also, they will check if you should lose or gain weight before getting pregnant.  What is your exercise routine and what it is safe when you are pregnant.  If there are any physical disabilities that need to be addressed.  About spacing your pregnancies when there are other children.  If there is a financial problem that may affect you having a child. After  talking about the above points with your caregiver, your caregiver will give you advice on how to help treat and work with you on solving any issues, if necessary, before getting pregnant. The goal is to have a healthy and safe pregnancy for you and your baby. You should keep an accurate record of your menstrual periods because it will help in determining your due date. Immunizations that you should have before getting pregnant:   Regular measles, [place] measles (rubella) and mumps.  Tetanus and diphtheria.  Chickenpox, if not immune.  Herpes zoster (Varicella) if not immune.  Human papilloma virus vaccine (HPV) between the age of 44 and 78 years old.  Hepatitis A vaccine.  Hepatitis B vaccine.  Influenza vaccine.  Pneumococcal vaccine (pneumonia). You should avoid getting pregnant for one month after getting vaccinated with a live virus vaccine such as [place] measles (rubella) vaccine. Other immunizations may be necessary depending on where you live, such as malaria. Ask your caregiver if any other immunizations are needed for you. HOME CARE INSTRUCTIONS   Follow the advice of your caregiver.  Before getting pregnant:  Begin taking vitamins, supplements, and 0.4 milligrams folic acid daily.  Get your immunizations up-to-date.  Get help from a nutrition counselor if you do not understand what a balanced diet is, need help with a special medical diet or if you need help to lose or gain weight.  Begin exercising.  Stop smoking, taking illegal drugs, and drinking alcoholic beverages.  Get counseling if there is and type of domestic violence.  Get checked for sexually transmitted diseases including HIV.  Get any medical problems under control (diabetes, high blood pressure, convulsions, asthma or others).  Resolve any financial concerns.  Be sure you and your spouse or partner are ready to have a baby.  Keep an accurate record of your menstrual periods. Document Released:  04/30/2008 Document Revised: 08/10/2011 Document Reviewed: 04/30/2008 [facility] Patient Information 2013 [place], [place].

## 2012-06-08 NOTE — Assessment & Plan Note (Signed)
Discussed how flu may affect the villi and likely course and reason for cough.--tincture of time recommended.

## 2012-06-08 NOTE — Assessment & Plan Note (Signed)
On PNV's.  Preconception info given.

## 2012-06-08 NOTE — Progress Notes (Signed)
  Subjective:    Patient ID: Tanya Webb, female    DOB: 1975-08-14, 37 y.o.   MRN: [id number]  HPI  Had documented flu over the christmas holiday and has persistent cough.  No other sx's.  Denies fever/chills.  Interested in getting pregnant.  Has gotten back together with her husband.  Last pregnancy achieved with IUI.  Negative w/u.  Turns 37 this year.  Review of Systems  Constitutional: Negative for fever and chills.  HENT: Negative for congestion and rhinorrhea.   Respiratory: Positive for cough. Negative for chest tightness and shortness of breath.   Cardiovascular: Negative for leg swelling.  Gastrointestinal: Negative for nausea, abdominal pain and diarrhea.       Objective:   Physical Exam  Vitals reviewed. Constitutional: She appears well-developed and well-nourished.  HENT:  Head: Normocephalic and atraumatic.  Neck: Neck supple.  Cardiovascular: Normal rate and regular rhythm.   Pulmonary/Chest: Effort normal and breath sounds normal. No respiratory distress. She has no wheezes. She has no rales.  Abdominal: Soft. There is no tenderness.          Assessment & Plan:

## 2013-02-15 ENCOUNTER — Ambulatory Visit (INDEPENDENT_AMBULATORY_CARE_PROVIDER_SITE_OTHER): Payer: BC Managed Care – PPO | Admitting: Family Medicine

## 2013-02-15 ENCOUNTER — Encounter: Payer: Self-pay | Admitting: Family Medicine

## 2013-02-15 VITALS — BP 118/85 | HR 89 | Temp 98.3°F | Ht 67.0 in | Wt 158.0 lb

## 2013-02-15 DIAGNOSIS — J189 Pneumonia, unspecified organism: Secondary | ICD-10-CM

## 2013-02-15 MED ORDER — AZITHROMYCIN 500 MG PO TABS: 500.0000 mg | ORAL_TABLET | Freq: Every day | ORAL | Status: DC

## 2013-02-15 NOTE — Progress Notes (Signed)
Subjective:     Patient ID: Tanya Webb, female   DOB: 12-03-1975, 37 y.o.   MRN: [id number]  HPI  37 yo healthy CF here for cough.   - 10 days of cough - feels like right side of lung is tight - started with congestion and runny nose  - that is now gone - coughing up green junk - cough seems to be worsening.  - goes into fits at times. No posttussive emesis though - no fevers, chills. Chest pain, wheezing, sob. No HA, sore throat.   + Fatigue - keeps her up at night  No weight loss or hemoptysis   Review of Systems See above    Objective:   Physical Exam Filed Vitals:   02/15/13 1418  BP: 118/85  Pulse: 89  Temp: 98.3 F (36.8 C)  TempSrc: Oral  Height: 5\' 7"  (1.702 m)  Weight: 71.668 kg (158 lb)    Gen: NAD,  HEENT: oropharynx with evidence of irritation but without exudate. Nares patent bilaterally. No significant lymphadenopathy.  PULM: decreased breath sounds in right base. Crackles in right base. Other lung fields clear to auscultation without wheeze. Good effort throughout.  CV:RRR. Normal S1 and S2 EXT: good capillary refill.     Assessment:     Pneumonia      Plan:     Concerning for developing right base PNA given decreased breath sounds and crackles.  - given course, will treat with azithromycin 500mg  x 5 days for pertussis coverage also - recommend robitussin for cough OTC - advised should the tightness and phlegm not improve after abx, to call back for another appt.  - cough may linger and this can be normal - discussed that xray would likely not change management given lung findings but should she not improve, we will need to obtain an xray at that time.

## 2013-02-15 NOTE — Patient Instructions (Signed)

## 2016-02-17 ENCOUNTER — Telehealth: Payer: Self-pay | Admitting: Family Medicine

## 2016-02-17 NOTE — Telephone Encounter (Signed)
Pt had CT scan done at hospital over the weekend. She went because of umbilical hernia,. The scan showed gallstones, kidney damage and a small spot on her right lower lung.  She is very concerned.

## 2016-02-18 NOTE — Telephone Encounter (Signed)
Pt would like to know the name of a general surgeon. She would like to see the surgeon ASAP about the gallbladder and kidney issues that showed up at the scan.

## 2016-02-18 NOTE — Telephone Encounter (Signed)
Spoke with patient and she in the process of getting her notes from the hospital visit faxed to [place] and also contacting [facility] [place] [facility]. Kionte Baumgardner,[name]

## 2016-02-24 ENCOUNTER — Ambulatory Visit: Payer: Self-pay | Admitting: Surgery

## 2016-02-24 ENCOUNTER — Telehealth: Payer: Self-pay | Admitting: *Deleted

## 2016-02-24 DIAGNOSIS — N139 Obstructive and reflux uropathy, unspecified: Secondary | ICD-10-CM

## 2016-02-24 NOTE — H&P (Signed)
Tanya Webb. Siner 02/24/2016 9:32 AM Location: [facility] Patient #: [id number] DOB: November 16, 1975 Married / Language: Cleophus Molt / Race: White Female  History of Present Illness Tanya Moores A. [name] MD; 02/24/2016 12:28 PM) Patient words: GB   Patient presents for evaluation of abdominal pain. She was seen 9 days ago in the emergency room at [facility]. She's had a history of epigastric abdominal pain after eating. Greasy fatty spicy foods make it worse. Location is her upper abdomen and lower chest region. The pain is sharp associated with nausea and comes and goes. She has a history of a small umbilical hernia as well. Her pain became severe 9 days ago and she sought care initially at an urgent care and then subsequent emergency room visit. CT scan showed debris in the gallbladder consistent with either gallstones versus sludge. She also has a small umbilical hernia, a common bile duct that measured 1.3 cm with normal LFTs and right hydroureter. She comes in today to discuss her gallbladder issues and umbilical hernia. She has had no other flareups of pain like this but his had multiple milder flareups of similar pain after eating with this being the first severe episode.  The patient is a 40 year old female.   Other Problems [name], [place]; 02/24/2016 9:33 AM) Gastroesophageal Reflux Disease Inguinal Hernia  Past Surgical History [name], [place]; 02/24/2016 9:33 AM) Oral Surgery  Diagnostic Studies History [name], [place]; 02/24/2016 9:33 AM) Colonoscopy never Mammogram 1-3 years ago Pap Smear 1-5 years ago  Allergies [name], [place]; 02/24/2016 9:33 AM) No Known Drug Allergies 02/24/2016  Medication History [name], [place]; 02/24/2016 9:33 AM) No Current Medications Medications Reconciled  Social History [name], [place]; 02/24/2016 9:33 AM) Alcohol use Occasional alcohol use. Caffeine use Coffee. No  drug use Tobacco use Never smoker.  Family History [name], [place]; 02/24/2016 9:33 AM) Alcohol Abuse Brother, Father. Depression Mother. Diabetes Mellitus Brother, Father. Heart Disease Father. Respiratory Condition Mother, Sister.  Pregnancy / Birth History [name], [place]; 02/24/2016 9:33 AM) Age at menarche 30 years. Gravida 2 Length (months) of breastfeeding 3-6 Maternal age 55-35 Para 1 Regular periods     Review of Systems [name] [name]; 02/24/2016 9:33 AM) General Present- Appetite Loss and Weight Gain. Not Present- Chills, Fatigue, Fever, Night Sweats and Weight Loss. Skin Not Present- Change in Wart/Mole, Dryness, Hives, Jaundice, New Lesions, Non-Healing Wounds, Rash and Ulcer. HEENT Present- Wears glasses/contact lenses. Not Present- Earache, Hearing Loss, Hoarseness, Nose Bleed, Oral Ulcers, Ringing in the Ears, Seasonal Allergies, Sinus Pain, Sore Throat, Visual Disturbances and Yellow Eyes. Respiratory Not Present- Bloody sputum, Chronic Cough, Difficulty Breathing, Snoring and Wheezing. Cardiovascular Not Present- Chest Pain, Difficulty Breathing Lying Down, Leg Cramps, Palpitations, Rapid Heart Rate, Shortness of Breath and Swelling of Extremities. Gastrointestinal Present- Abdominal Pain, Bloating and Nausea. Not Present- Bloody Stool, Change in Bowel Habits, Chronic diarrhea, Constipation, Difficulty Swallowing, Excessive gas, Gets full quickly at meals, Hemorrhoids, Indigestion, Rectal Pain and Vomiting. Female Genitourinary Not Present- Frequency, Nocturia, Painful Urination, Pelvic Pain and Urgency. Musculoskeletal Not Present- Back Pain, Joint Pain, Joint Stiffness, Muscle Pain, Muscle Weakness and Swelling of Extremities. Neurological Not Present- Decreased Memory, Fainting, Headaches, Numbness, Seizures, Tingling, Tremor, Trouble walking and Weakness. Psychiatric Not Present- Anxiety, Bipolar, Change in Sleep Pattern,  Depression, Fearful and Frequent crying. Endocrine Not Present- Cold Intolerance, Excessive Hunger, Hair Changes, Heat Intolerance, Hot flashes and New Diabetes. Hematology Not Present-  Blood Thinners, Easy Bruising, Excessive bleeding, Gland problems, HIV and Persistent Infections.  Vitals [name] [name]; 02/24/2016 9:32 AM) 02/24/2016 9:32 AM Weight: 163.38 lb Height: 67in Body Surface Area: 1.86 m Body Mass Index: 25.59 kg/m  BP: 122/84 (Sitting, Left Arm, Standard)      Physical Exam ([name] MD; 02/24/2016 12:28 PM)  General Mental Status-Alert. General Appearance-Consistent with stated age. Hydration-Well hydrated. Voice-Normal.  Head and Neck Head-normocephalic, atraumatic with no lesions or palpable masses.  Eye Eyeball - Bilateral-Extraocular movements intact. Sclera/Conjunctiva - Bilateral-No scleral icterus.  Chest and Lung Exam Chest and lung exam reveals -quiet, even and easy respiratory effort with no use of accessory muscles and on auscultation, normal breath sounds, no adventitious sounds and normal vocal resonance. Inspection Chest Wall - Normal. Back - normal.  Cardiovascular Cardiovascular examination reveals -on palpation PMI is normal in location and amplitude, no palpable S3 or S4. Normal cardiac borders., normal heart sounds, regular rate and rhythm with no murmurs, carotid auscultation reveals no bruits and normal pedal pulses bilaterally.  Abdomen Inspection Inspection of the abdomen reveals - No Hernias. Skin - Scar - no surgical scars. Palpation/Percussion Palpation and Percussion of the abdomen reveal - Soft, Non Tender, No Rebound tenderness, No Rigidity (guarding) and No hepatosplenomegaly. Auscultation Auscultation of the abdomen reveals - Bowel sounds normal. Note: Small umbilical hernia reducible  Neurologic Neurologic evaluation reveals -alert and oriented x 3 with no impairment of recent or  remote memory. Mental Status-Normal.  Musculoskeletal Normal Exam - Left-Upper Extremity Strength Normal and Lower Extremity Strength Normal. Normal Exam - Right-Upper Extremity Strength Normal, Lower Extremity Weakness.    Assessment & Plan (Jacqulyne Gladue A. Stefanee Mckell MD; 02/24/2016 12:30 PM)  SYMPTOMATIC CHOLELITHIASIS (K80.20) Impression: Discussed nonoperative and operative means of treating her biliary colic. Discussed the pros and cons of laparoscopic cholecystectomy and the fact this will probably relieve her symptoms and 80% of cases. Discussed other etiologies of her abdominal pain. Discussed the umbilical hernia as well. The pros and cons of surgery were discussed with her and her husband at great length today after a long discussion of her symptoms and review of her tests. The procedure has been discussed with the patient. Risks of laparoscopic cholecystectomy include bleeding, infection, bile duct injury, leak, death, open surgery, diarrhea, other surgery, organ injury, blood vessel injury, DVT, and additional care.  Current Plans You are being scheduled for surgery - Our schedulers will call you.  You should hear from our office's scheduling department within 5 working days about the location, date, and time of surgery. We try to make accommodations for patient's preferences in scheduling surgery, but sometimes the OR schedule or the surgeon's schedule prevents [place] from making those accommodations.  If you have not heard from our office [phone]) in 5 working days, call the office and ask for your surgeon's nurse.  If you have other questions about your diagnosis, plan, or surgery, call the office and ask for your surgeon's nurse.  Pt Education - Pamphlet Given - Laparoscopic Gallbladder Surgery: discussed with patient and provided information. Written instructions provided The anatomy & physiology of hepatobiliary & pancreatic function was discussed. The pathophysiology of  gallbladder dysfunction was discussed. Natural history risks without surgery was discussed. I feel the risks of no intervention will lead to serious problems that outweigh the operative risks; therefore, I recommended cholecystectomy to remove the pathology. I explained laparoscopic techniques with possible need for an open approach. Probable cholangiogram to evaluate the bilary tract was  explained as well.  Risks such as bleeding, infection, abscess, leak, injury to other organs, need for further treatment, heart attack, death, and other risks were discussed. I noted a good likelihood this will help address the problem. Possibility that this will not correct all abdominal symptoms was explained. Goals of post-operative recovery were discussed as well. We will work to minimize complications. An educational handout further explaining the pathology and treatment options was given as well. Questions were answered. The patient expresses understanding & wishes to proceed with surgery.  Pt Education - [facility] Laparosopic Post Op HCI (Gross) Pt Education - Laparoscopic Cholecystectomy: gallbladder UMBILICAL HERNIA WITHOUT OBSTRUCTION AND WITHOUT GANGRENE (K42.9) Impression: Recommend repair with primary closure since hernia defect at umbilical port site and this is very small. The risk of hernia repair include bleeding, infection, organ injury, bowel injury, bladder injury, nerve injury recurrent hernia, blood clots, worsening of underlying condition, chronic pain, mesh use, open surgery, death, and the need for other operattions. Pt agrees to proceed  Current Plans The anatomy & physiology of the abdominal wall was discussed. The pathophysiology of hernias was discussed. Natural history risks without surgery including progeressive enlargement, pain, incarceration, & strangulation was discussed. Contributors to complications such as smoking, obesity, diabetes, prior surgery, etc were discussed.  I feel the risks of  no intervention will lead to serious problems that outweigh the operative risks; therefore, I recommended surgery to reduce and repair the hernia. I explained laparoscopic techniques with possible need for an open approach. I noted the probable use of mesh to patch and/or buttress the hernia repair  Risks such as bleeding, infection, abscess, need for further treatment, heart attack, death, and other risks were discussed. I noted a good likelihood this will help address the problem. Goals of post-operative recovery were discussed as well. Possibility that this will not correct all symptoms was explained. I stressed the importance of low-impact activity, aggressive pain control, avoiding constipation, & not pushing through pain to minimize risk of post-operative chronic pain or injury. Possibility of reherniation especially with smoking, obesity, diabetes, immunosuppression, and other health conditions was discussed. We will work to minimize complications.  An educational handout further explaining the pathology & treatment options was given as well. Questions were answered. The patient expresses understanding & wishes to proceed with surgery.  Pt Education - Pamphlet Given - Hernia Surgery: discussed with patient and provided information. The anatomy & physiology of the abdominal wall and pelvic floor was discussed. The pathophysiology of hernias in the inguinal and pelvic region was discussed. Natural history risks such as progressive enlargement, pain, incarceration, and strangulation was discussed. Contributors to complications such as smoking, obesity, diabetes, prior surgery, etc were discussed.  I feel the risks of no intervention will lead to serious problems that outweigh the operative risks; therefore, I recommended surgery to reduce and repair the hernia. I explained laparoscopic techniques with possible need for an open approach. I noted usual use of mesh to patch and/or buttress hernia  repair  Risks such as bleeding, infection, abscess, need for further treatment, heart attack, death, and other risks were discussed. I noted a good likelihood this will help address the problem. Goals of post-operative recovery were discussed as well. Possibility that this will not correct all symptoms was explained. I stressed the importance of low-impact activity, aggressive pain control, avoiding constipation, & not pushing through pain to minimize risk of post-operative chronic pain or injury. Possibility of reherniation was discussed. We will work to minimize complications.  An educational handout further explaining the pathology & treatment options was given as well. Questions were answered. The patient expresses understanding & wishes to proceed with surgery.  Pt Education - Consent for inguinal hernia - [place]: discussed with patient and provided information.

## 2016-02-24 NOTE — Telephone Encounter (Signed)
Will forward to MD. Jazmin Hartsell,[name]  

## 2016-02-24 NOTE — Telephone Encounter (Signed)
Called to reschedule appt (Dr. Kennon Rounds not @ [facility] tomorrow).  She rescheduled for 03/04/16 but would like to know if Dr. Kennon Rounds would refer her to urologist for her Kidney Stones without being seen. Tanya Webb, Salome Spotted, [name]

## 2016-02-25 ENCOUNTER — Ambulatory Visit: Payer: Self-pay | Admitting: Family Medicine

## 2016-02-26 NOTE — Telephone Encounter (Signed)
Patient has been scheduled for 03/04/16. Tanya Webb,[name]

## 2016-03-04 ENCOUNTER — Ambulatory Visit (INDEPENDENT_AMBULATORY_CARE_PROVIDER_SITE_OTHER): Payer: BLUE CROSS/BLUE SHIELD | Admitting: Family Medicine

## 2016-03-04 ENCOUNTER — Encounter: Payer: Self-pay | Admitting: Family Medicine

## 2016-03-04 VITALS — BP 135/90 | HR 96 | Temp 98.7°F | Ht 67.0 in | Wt 162.0 lb

## 2016-03-04 DIAGNOSIS — N132 Hydronephrosis with renal and ureteral calculous obstruction: Secondary | ICD-10-CM

## 2016-03-04 DIAGNOSIS — K8020 Calculus of gallbladder without cholecystitis without obstruction: Secondary | ICD-10-CM

## 2016-03-04 DIAGNOSIS — R911 Solitary pulmonary nodule: Secondary | ICD-10-CM | POA: Diagnosis not present

## 2016-03-04 DIAGNOSIS — N131 Hydronephrosis with ureteral stricture, not elsewhere classified: Secondary | ICD-10-CM | POA: Insufficient documentation

## 2016-03-04 NOTE — Assessment & Plan Note (Signed)
Begin with PA and Lat CXR

## 2016-03-04 NOTE — Assessment & Plan Note (Signed)
For surgical removal

## 2016-03-04 NOTE — Progress Notes (Signed)
Subjective:    Patient ID: Tanya Webb is a 40 y.o. female presenting with Hospitalization Follow-up  on 03/04/2016  HPI: Patient returns for f/u following ED visit for abdominal pain. Had CT abdomen and pelvis and noted to have small lung nodule that is to be followed up. The urgent care MD told her she needed her whole chest screened. Also noted to have right hydroureteronephrosis with some stones and an enlarged fibroid uterus. She tells me her cycles are very heavy and she has been considering endometrial ablation. Also noted to have gall stones and small hernia has seen Gen surg. And will be scheduling surgery soon.  Review of Systems  Constitutional: Negative for chills and fever.  Respiratory: Negative for shortness of breath.   Cardiovascular: Negative for chest pain.  Gastrointestinal: Negative for abdominal pain, nausea and vomiting.  Genitourinary: Negative for dysuria.  Skin: Negative for rash.      Objective:    BP 135/90   Pulse 96   Temp 98.7 F (37.1 C) (Oral)   Ht 5\' 7"  (1.702 m)   Wt 162 lb (73.5 kg)   LMP 02/28/2016 (Exact Date)   BMI 25.37 kg/m  Physical Exam  Constitutional: She is oriented to person, place, and time. She appears well-developed and well-nourished. No distress.  HENT:  Head: Normocephalic and atraumatic.  Eyes: No scleral icterus.  Neck: Neck supple.  Cardiovascular: Normal rate.   Pulmonary/Chest: Effort normal.  Abdominal: Soft.  Neurological: She is alert and oriented to person, place, and time.  Skin: Skin is warm and dry.  Psychiatric: She has a normal mood and affect.    CT results 02/15/16 Lower chest: 5 mm nodule over the right lower lobe.  Hepatobiliary: Minimal sludge versus noncalcified stones within the gallbladder. Dilatation of the common bile duct measuring 1.3 cm with minimal prominence of the central intrahepatic ducts of the left lobe. No evidence of ductal stone. No focal liver mass.  Pancreas: Within  normal.  Spleen: Within normal.  Adrenals/Urinary Tract: Adrenal glands are normal. Kidneys are normal in size. Single sub cm hypodensity over the upper pole left kidney likely a cyst but too small to characterize. Mild lobular contour to the right kidney with patchy areas of renal cortical scarring. Several small right renal stones. Mild right-sided hydronephrosis with moderate dilatation of the right renal pelvis and ureter the level of the UVJ. No evidence of stone or obstructing lesion at the UVJ left ureter is within normal. Bladder is within normal.  Stomach/Bowel: Stomach is within normal. Mild wall thickening of the distal duodenum and several proximal jejunal loops. Remaining small bowel is within normal. Appendix is normal. There is mild fecal retention throughout the colon. Colon is otherwise unremarkable.  Vascular/Lymphatic: Within normal.  Reproductive: Uterus is enlarged and heterogeneous likely due to multiple leiomyomas. Ovaries within normal. Trace amount of free pelvic fluid.  Other: Small midline ventral hernia just above the umbilicus containing mostly peritoneal fat with a a tiny amount of fluid.  Musculoskeletal: Minimal degenerative change of the spine.     Assessment & Plan:   Problem List Items Addressed This Visit      Unprioritized   Calculus of gallbladder without cholecystitis without obstruction    For surgical removal      Hydronephrosis due to obstruction of ureter    Urology referral needed--may need hysterectomy if the reason for obstruction is related to uterus.      Lung nodule - Primary    Begin  with PA and Lat CXR      Relevant Orders   DG Chest 2 View    Other Visit Diagnoses   None.      Total face-to-face time with patient: 25 minutes. Over 50% of encounter was spent on counseling and coordination of care. Return in about 3 months (around 06/04/2016) for a follow-up.  Donnamae Jude 03/04/2016 9:09 AM

## 2016-03-04 NOTE — Assessment & Plan Note (Signed)
Urology referral needed--may need hysterectomy if the reason for obstruction is related to uterus.

## 2016-03-04 NOTE — Patient Instructions (Signed)

## 2016-03-27 ENCOUNTER — Encounter (HOSPITAL_COMMUNITY): Payer: Self-pay

## 2016-03-27 ENCOUNTER — Encounter (HOSPITAL_COMMUNITY)
Admission: RE | Admit: 2016-03-27 | Discharge: 2016-03-27 | Disposition: A | Discharge status: Home / Self Care | Payer: BLUE CROSS/BLUE SHIELD | Source: Ambulatory Visit | Attending: Surgery | Admitting: Surgery

## 2016-03-27 DIAGNOSIS — Z01818 Encounter for other preprocedural examination: Secondary | ICD-10-CM | POA: Insufficient documentation

## 2016-03-27 DIAGNOSIS — K429 Umbilical hernia without obstruction or gangrene: Secondary | ICD-10-CM | POA: Insufficient documentation

## 2016-03-27 HISTORY — DX: Personal history of urinary calculi: Z87.442

## 2016-03-27 HISTORY — DX: Gastro-esophageal reflux disease without esophagitis: K21.9

## 2016-03-27 HISTORY — DX: Unspecified abdominal pain: R10.9

## 2016-03-27 LAB — CBC WITH DIFFERENTIAL/PLATELET
BASOS ABS: 0.1 10*3/uL (ref 0.0–0.1)
BASOS PCT: 1 %
Eosinophils Absolute: 0.1 10*3/uL (ref 0.0–0.7)
Eosinophils Relative: 2 %
HEMATOCRIT: 38.7 % (ref 36.0–46.0)
HEMOGLOBIN: 12.8 g/dL (ref 12.0–15.0)
LYMPHS PCT: 30 %
Lymphs Abs: 1.5 10*3/uL (ref 0.7–4.0)
MCH: 30.2 pg (ref 26.0–34.0)
MCHC: 33.1 g/dL (ref 30.0–36.0)
MCV: 91.3 fL (ref 78.0–100.0)
Monocytes Absolute: 0.5 10*3/uL (ref 0.1–1.0)
Monocytes Relative: 10 %
NEUTROS ABS: 2.8 10*3/uL (ref 1.7–7.7)
NEUTROS PCT: 57 %
Platelets: 233 10*3/uL (ref 150–400)
RBC: 4.24 MIL/uL (ref 3.87–5.11)
RDW: 12.8 % (ref 11.5–15.5)
WBC: 5 10*3/uL (ref 4.0–10.5)

## 2016-03-27 LAB — COMPREHENSIVE METABOLIC PANEL
ALBUMIN: 3.9 g/dL (ref 3.5–5.0)
ALK PHOS: 35 U/L — AB (ref 38–126)
ALT: 21 U/L (ref 14–54)
AST: 22 U/L (ref 15–41)
Anion gap: 6 (ref 5–15)
BILIRUBIN TOTAL: 0.6 mg/dL (ref 0.3–1.2)
BUN: 12 mg/dL (ref 6–20)
CO2: 24 mmol/L (ref 22–32)
CREATININE: 0.77 mg/dL (ref 0.44–1.00)
Calcium: 9.4 mg/dL (ref 8.9–10.3)
Chloride: 107 mmol/L (ref 101–111)
GFR calc Af Amer: >60 mL/min (ref >60)
GFR calc non Af Amer: >60 mL/min (ref >60)
GLUCOSE: 94 mg/dL (ref 65–99)
POTASSIUM: 4.1 mmol/L (ref 3.5–5.1)
Sodium: 137 mmol/L (ref 135–145)
TOTAL PROTEIN: 6.7 g/dL (ref 6.5–8.1)

## 2016-03-27 LAB — HCG, SERUM, QUALITATIVE: Preg, Serum: NEGATIVE

## 2016-03-27 MED ORDER — CHLORHEXIDINE GLUCONATE CLOTH 2 % EX PADS: 6.0000 | MEDICATED_PAD | Freq: Once | CUTANEOUS | Status: DC

## 2016-03-27 NOTE — Pre-Procedure Instructions (Signed)
    Oluwatomisin Timblin  03/27/2016      [facility] - [place], [place] - [address] AT [place] [address] [place] [address] Phone: [phone] Fax: [phone]    Your procedure is scheduled on 04/01/16.  Report to [facility] Admitting at 9 A.M.  Call this number if you have problems the morning of surgery:  [phone]   Remember:  Do not eat food or drink liquids after midnight.  Take these medicines the morning of surgery with A SIP OF WATER --none   Do not wear jewelry, make-up or nail polish.  Do not wear lotions, powders, or perfumes, or deoderant.  Do not shave 48 hours prior to surgery.  Men may shave face and neck.  Do not bring valuables to the hospital.  [facility] is not responsible for any belongings or valuables.  Contacts, dentures or bridgework may not be worn into surgery.  Leave your suitcase in the car.  After surgery it may be brought to your room.  For patients admitted to the hospital, discharge time will be determined by your treatment team.  Patients discharged the day of surgery will not be allowed to drive home.   Name and phone number of your driver:    Special instructions:  Do not take any aspirin,anti-inflammatories,vitamins,or herbal supplements 5-7 days prior to surgery.  Please read over the following fact sheets that you were given.

## 2016-03-31 MED ORDER — ACETAMINOPHEN 500 MG PO TABS
1000.0000 mg | ORAL_TABLET | ORAL | Status: AC
  Administered 2016-04-01: 1000 mg via ORAL
  Filled 2016-03-31: qty 2

## 2016-03-31 MED ORDER — CELECOXIB 200 MG PO CAPS
400.0000 mg | ORAL_CAPSULE | ORAL | Status: AC
  Administered 2016-04-01: 200 mg via ORAL
  Filled 2016-03-31: qty 2

## 2016-03-31 MED ORDER — DEXTROSE 5 % IV SOLN
3.0000 g | INTRAVENOUS | Status: AC
  Administered 2016-04-01: 3 g via INTRAVENOUS
  Filled 2016-03-31: qty 3000

## 2016-03-31 MED ORDER — GABAPENTIN 300 MG PO CAPS
300.0000 mg | ORAL_CAPSULE | ORAL | Status: AC
  Administered 2016-04-01: 300 mg via ORAL
  Filled 2016-03-31: qty 1

## 2016-04-01 ENCOUNTER — Encounter (HOSPITAL_COMMUNITY)
Admission: RE | Disposition: A | Discharge status: Home / Self Care | Payer: Self-pay | Source: Ambulatory Visit | Attending: Surgery

## 2016-04-01 ENCOUNTER — Ambulatory Visit (HOSPITAL_COMMUNITY): Payer: BLUE CROSS/BLUE SHIELD | Admitting: Certified Registered Nurse Anesthetist

## 2016-04-01 ENCOUNTER — Ambulatory Visit (HOSPITAL_COMMUNITY)
Admission: RE | Admit: 2016-04-01 | Discharge: 2016-04-01 | Disposition: A | Discharge status: Home / Self Care | Payer: BLUE CROSS/BLUE SHIELD | Source: Ambulatory Visit | Attending: Surgery | Admitting: Surgery

## 2016-04-01 ENCOUNTER — Ambulatory Visit (HOSPITAL_COMMUNITY): Payer: BLUE CROSS/BLUE SHIELD

## 2016-04-01 DIAGNOSIS — K219 Gastro-esophageal reflux disease without esophagitis: Secondary | ICD-10-CM | POA: Diagnosis not present

## 2016-04-01 DIAGNOSIS — Z91030 Bee allergy status: Secondary | ICD-10-CM | POA: Insufficient documentation

## 2016-04-01 DIAGNOSIS — K8010 Calculus of gallbladder with chronic cholecystitis without obstruction: Secondary | ICD-10-CM | POA: Insufficient documentation

## 2016-04-01 DIAGNOSIS — Z419 Encounter for procedure for purposes other than remedying health state, unspecified: Secondary | ICD-10-CM

## 2016-04-01 DIAGNOSIS — K8020 Calculus of gallbladder without cholecystitis without obstruction: Secondary | ICD-10-CM | POA: Diagnosis present

## 2016-04-01 HISTORY — PX: CHOLECYSTECTOMY: SHX55

## 2016-04-01 HISTORY — PX: UMBILICAL HERNIA REPAIR: SHX196

## 2016-04-01 SURGERY — LAPAROSCOPIC CHOLECYSTECTOMY WITH INTRAOPERATIVE CHOLANGIOGRAM
Anesthesia: General | Site: Abdomen

## 2016-04-01 MED ORDER — OXYCODONE-ACETAMINOPHEN 5-325 MG PO TABS: 1.0000 | ORAL_TABLET | ORAL | 0 refills | Status: DC | PRN

## 2016-04-01 MED ORDER — CEFAZOLIN SODIUM-DEXTROSE 2-4 GM/100ML-% IV SOLN
INTRAVENOUS | Status: AC
  Filled 2016-04-01: qty 100

## 2016-04-01 MED ORDER — LIDOCAINE 2% (20 MG/ML) 5 ML SYRINGE
INTRAMUSCULAR | Status: DC | PRN
  Administered 2016-04-01: 80 mg via INTRAVENOUS

## 2016-04-01 MED ORDER — PROPOFOL 10 MG/ML IV BOLUS
INTRAVENOUS | Status: DC | PRN
  Administered 2016-04-01: 130 mg via INTRAVENOUS

## 2016-04-01 MED ORDER — LIDOCAINE 2% (20 MG/ML) 5 ML SYRINGE
INTRAMUSCULAR | Status: AC
  Filled 2016-04-01: qty 5

## 2016-04-01 MED ORDER — BUPIVACAINE-EPINEPHRINE 0.5% -1:200000 IJ SOLN
INTRAMUSCULAR | Status: DC | PRN
  Administered 2016-04-01: 7 mL

## 2016-04-01 MED ORDER — MIDAZOLAM HCL 2 MG/2ML IJ SOLN
INTRAMUSCULAR | Status: AC
  Filled 2016-04-01: qty 2

## 2016-04-01 MED ORDER — FENTANYL CITRATE (PF) 100 MCG/2ML IJ SOLN
INTRAMUSCULAR | Status: DC | PRN
  Administered 2016-04-01: 50 ug via INTRAVENOUS
  Administered 2016-04-01: 100 ug via INTRAVENOUS
  Administered 2016-04-01 (×3): 50 ug via INTRAVENOUS

## 2016-04-01 MED ORDER — PHENYLEPHRINE 40 MCG/ML (10ML) SYRINGE FOR IV PUSH (FOR BLOOD PRESSURE SUPPORT)
PREFILLED_SYRINGE | INTRAVENOUS | Status: AC
  Filled 2016-04-01: qty 10

## 2016-04-01 MED ORDER — ROCURONIUM BROMIDE 10 MG/ML (PF) SYRINGE
PREFILLED_SYRINGE | INTRAVENOUS | Status: DC | PRN
  Administered 2016-04-01: 10 mg via INTRAVENOUS
  Administered 2016-04-01: 40 mg via INTRAVENOUS
  Administered 2016-04-01: 10 mg via INTRAVENOUS

## 2016-04-01 MED ORDER — PHENYLEPHRINE 40 MCG/ML (10ML) SYRINGE FOR IV PUSH (FOR BLOOD PRESSURE SUPPORT)
PREFILLED_SYRINGE | INTRAVENOUS | Status: DC | PRN
  Administered 2016-04-01: 40 ug via INTRAVENOUS
  Administered 2016-04-01: 120 ug via INTRAVENOUS

## 2016-04-01 MED ORDER — SUGAMMADEX SODIUM 200 MG/2ML IV SOLN
INTRAVENOUS | Status: DC | PRN
  Administered 2016-04-01: 200 mg via INTRAVENOUS

## 2016-04-01 MED ORDER — SODIUM CHLORIDE 0.9 % IR SOLN
Status: DC | PRN
  Administered 2016-04-01: 1000 mL

## 2016-04-01 MED ORDER — HYDROMORPHONE HCL 1 MG/ML IJ SOLN: 0.2500 mg | INTRAMUSCULAR | Status: DC | PRN

## 2016-04-01 MED ORDER — FENTANYL CITRATE (PF) 100 MCG/2ML IJ SOLN
INTRAMUSCULAR | Status: AC
  Filled 2016-04-01: qty 2

## 2016-04-01 MED ORDER — 0.9 % SODIUM CHLORIDE (POUR BTL) OPTIME
TOPICAL | Status: DC | PRN
  Administered 2016-04-01 (×2): 1000 mL

## 2016-04-01 MED ORDER — IOPAMIDOL (ISOVUE-300) INJECTION 61%
INTRAVENOUS | Status: AC
  Filled 2016-04-01: qty 50

## 2016-04-01 MED ORDER — MEPERIDINE HCL 25 MG/ML IJ SOLN: 6.2500 mg | INTRAMUSCULAR | Status: DC | PRN

## 2016-04-01 MED ORDER — IOPAMIDOL (ISOVUE-300) INJECTION 61%
INTRAVENOUS | Status: DC | PRN
  Administered 2016-04-01: 17 mL

## 2016-04-01 MED ORDER — FENTANYL CITRATE (PF) 100 MCG/2ML IJ SOLN
INTRAMUSCULAR | Status: AC
  Filled 2016-04-01: qty 4

## 2016-04-01 MED ORDER — SUGAMMADEX SODIUM 200 MG/2ML IV SOLN
INTRAVENOUS | Status: AC
  Filled 2016-04-01: qty 2

## 2016-04-01 MED ORDER — IBUPROFEN 400 MG PO TABS
800.0000 mg | ORAL_TABLET | ORAL | Status: AC
  Administered 2016-04-01: 800 mg via ORAL
  Filled 2016-04-01 (×2): qty 2

## 2016-04-01 MED ORDER — ONDANSETRON HCL 4 MG/2ML IJ SOLN: 4.0000 mg | Freq: Once | INTRAMUSCULAR | Status: DC | PRN

## 2016-04-01 MED ORDER — ONDANSETRON HCL 4 MG/2ML IJ SOLN
INTRAMUSCULAR | Status: AC
  Filled 2016-04-01: qty 2

## 2016-04-01 MED ORDER — MIDAZOLAM HCL 5 MG/5ML IJ SOLN
INTRAMUSCULAR | Status: DC | PRN
  Administered 2016-04-01: 2 mg via INTRAVENOUS

## 2016-04-01 MED ORDER — DEXAMETHASONE SODIUM PHOSPHATE 4 MG/ML IJ SOLN
INTRAMUSCULAR | Status: DC | PRN
  Administered 2016-04-01: 7 mg via INTRAVENOUS

## 2016-04-01 MED ORDER — LACTATED RINGERS IV SOLN
INTRAVENOUS | Status: DC
Start: 2016-04-01 — End: 2016-04-01
  Administered 2016-04-01 (×2): via INTRAVENOUS

## 2016-04-01 MED ORDER — PROPOFOL 10 MG/ML IV BOLUS
INTRAVENOUS | Status: AC
  Filled 2016-04-01: qty 20

## 2016-04-01 MED ORDER — DEXAMETHASONE SODIUM PHOSPHATE 10 MG/ML IJ SOLN
INTRAMUSCULAR | Status: AC
  Filled 2016-04-01: qty 1

## 2016-04-01 MED ORDER — ROCURONIUM BROMIDE 10 MG/ML (PF) SYRINGE
PREFILLED_SYRINGE | INTRAVENOUS | Status: AC
  Filled 2016-04-01: qty 10

## 2016-04-01 MED ORDER — BUPIVACAINE-EPINEPHRINE (PF) 0.5% -1:200000 IJ SOLN
INTRAMUSCULAR | Status: AC
  Filled 2016-04-01: qty 30

## 2016-04-01 MED ORDER — ONDANSETRON HCL 4 MG/2ML IJ SOLN
INTRAMUSCULAR | Status: DC | PRN
  Administered 2016-04-01: 4 mg via INTRAVENOUS

## 2016-04-01 SURGICAL SUPPLY — 53 items
APPLIER CLIP ROT 10 11.4 M/L (STAPLE) ×3
BLADE SURG ROTATE 9660 (MISCELLANEOUS) IMPLANT
CANISTER SUCTION 2500CC (MISCELLANEOUS) ×3 IMPLANT
CHLORAPREP W/TINT 26ML (MISCELLANEOUS) ×3 IMPLANT
CLIP APPLIE ROT 10 11.4 M/L (STAPLE) ×2 IMPLANT
COVER SURGICAL LIGHT HANDLE (MISCELLANEOUS) ×3 IMPLANT
DERMABOND ADVANCED (GAUZE/BANDAGES/DRESSINGS) ×1
DERMABOND ADVANCED .7 DNX12 (GAUZE/BANDAGES/DRESSINGS) ×2 IMPLANT
DRAPE C-ARM 42X72 X-RAY (DRAPES) ×3 IMPLANT
DRAPE LAPAROTOMY 100X72 PEDS (DRAPES) ×3 IMPLANT
DRAPE UTILITY XL STRL (DRAPES) ×3 IMPLANT
DRAPE WARM FLUID 44X44 (DRAPE) ×3 IMPLANT
ELECT CAUTERY BLADE 6.4 (BLADE) ×3 IMPLANT
ELECT REM PT RETURN 9FT ADLT (ELECTROSURGICAL) ×3
ELECTRODE REM PT RTRN 9FT ADLT (ELECTROSURGICAL) ×2 IMPLANT
GAUZE SPONGE 4X4 12PLY STRL (GAUZE/BANDAGES/DRESSINGS) IMPLANT
GLOVE BIO SURGEON STRL SZ8 (GLOVE) ×3 IMPLANT
GLOVE BIOGEL PI IND STRL 8 (GLOVE) ×2 IMPLANT
GLOVE BIOGEL PI INDICATOR 8 (GLOVE) ×1
GOWN STRL REUS W/ TWL LRG LVL3 (GOWN DISPOSABLE) ×4 IMPLANT
GOWN STRL REUS W/ TWL XL LVL3 (GOWN DISPOSABLE) ×2 IMPLANT
GOWN STRL REUS W/TWL LRG LVL3 (GOWN DISPOSABLE) ×2
GOWN STRL REUS W/TWL XL LVL3 (GOWN DISPOSABLE) ×1
KIT BASIN OR (CUSTOM PROCEDURE TRAY) ×3 IMPLANT
KIT ROOM TURNOVER OR (KITS) ×3 IMPLANT
NEEDLE HYPO 25GX1X1/2 BEV (NEEDLE) ×3 IMPLANT
NS IRRIG 1000ML POUR BTL (IV SOLUTION) ×3 IMPLANT
PACK SURGICAL SETUP 50X90 (CUSTOM PROCEDURE TRAY) ×3 IMPLANT
PAD ARMBOARD 7.5X6 YLW CONV (MISCELLANEOUS) ×3 IMPLANT
PENCIL BUTTON HOLSTER BLD 10FT (ELECTRODE) ×3 IMPLANT
POUCH SPECIMEN RETRIEVAL 10MM (ENDOMECHANICALS) ×3 IMPLANT
SCISSORS LAP 5X35 DISP (ENDOMECHANICALS) ×3 IMPLANT
SET CHOLANGIOGRAPH 5 50 .035 (SET/KITS/TRAYS/PACK) ×3 IMPLANT
SET IRRIG TUBING LAPAROSCOPIC (IRRIGATION / IRRIGATOR) ×3 IMPLANT
SHEARS HARMONIC ACE PLUS 36CM (ENDOMECHANICALS) ×3 IMPLANT
SLEEVE ENDOPATH XCEL 5M (ENDOMECHANICALS) ×3 IMPLANT
SPECIMEN JAR SMALL (MISCELLANEOUS) IMPLANT
SPONGE LAP 18X18 X RAY DECT (DISPOSABLE) ×3 IMPLANT
SUT MNCRL AB 4-0 PS2 18 (SUTURE) ×3 IMPLANT
SUT MON AB 4-0 PC3 18 (SUTURE) IMPLANT
SUT NOVA NAB DX-16 0-1 5-0 T12 (SUTURE) ×3 IMPLANT
SUT VIC AB 3-0 SH 27 (SUTURE) ×1
SUT VIC AB 3-0 SH 27X BRD (SUTURE) ×2 IMPLANT
SYR CONTROL 10ML LL (SYRINGE) ×3 IMPLANT
TOWEL OR 17X24 6PK STRL BLUE (TOWEL DISPOSABLE) IMPLANT
TOWEL OR 17X26 10 PK STRL BLUE (TOWEL DISPOSABLE) ×3 IMPLANT
TRAY LAPAROSCOPIC MC (CUSTOM PROCEDURE TRAY) ×3 IMPLANT
TROCAR XCEL BLUNT TIP 100MML (ENDOMECHANICALS) ×3 IMPLANT
TROCAR XCEL NON-BLD 11X100MML (ENDOMECHANICALS) ×3 IMPLANT
TROCAR XCEL NON-BLD 5MMX100MML (ENDOMECHANICALS) ×3 IMPLANT
TUBE CONNECTING 12X1/4 (SUCTIONS) IMPLANT
TUBING INSUFFLATION (TUBING) ×3 IMPLANT
YANKAUER SUCT BULB TIP NO VENT (SUCTIONS) IMPLANT

## 2016-04-01 NOTE — Discharge Instructions (Signed)
[facility] ______[facility] °LAPAROSCOPIC SURGERY: POST OP INSTRUCTIONS °Always review your discharge instruction sheet given to you by the facility where your surgery was performed. °IF YOU HAVE DISABILITY OR FAMILY LEAVE FORMS, YOU MUST BRING THEM TO THE OFFICE FOR PROCESSING.   °DO NOT GIVE THEM TO YOUR DOCTOR. ° °1. A prescription for pain medication may be given to you upon discharge.  Take your pain medication as prescribed, if needed.  If narcotic pain medicine is not needed, then you may take acetaminophen (Tylenol) or ibuprofen (Advil) as needed. °2. Take your usually prescribed medications unless otherwise directed. °3. If you need a refill on your pain medication, please contact your pharmacy.  They will contact our office to request authorization. Prescriptions will not be filled after 5pm or on week-ends. °4. You should follow a light diet the first few days after arrival home, such as soup and crackers, etc.  Be sure to include lots of fluids daily. °5. Most patients will experience some swelling and bruising in the area of the incisions.  Ice packs will help.  Swelling and bruising can take several days to resolve.  °6. It is common to experience some constipation if taking pain medication after surgery.  Increasing fluid intake and taking a stool softener (such as Colace) will usually help or prevent this problem from occurring.  A mild laxative (Milk of Magnesia or Miralax) should be taken according to package instructions if there are no bowel movements after 48 hours. °7. Unless discharge instructions indicate otherwise, you may remove your bandages 24-48 hours after surgery, and you may shower at that time.  You may have steri-strips (small skin tapes) in place directly over the incision.  These strips should be left on the skin for 7-10 days.  If your surgeon used skin glue on the incision, you may shower in 24 hours.  The glue will flake off over the next 2-3 weeks.  Any sutures or  staples will be removed at the office during your follow-up visit. °8. ACTIVITIES:  You may resume regular (light) daily activities beginning the next day--such as daily self-care, walking, climbing stairs--gradually increasing activities as tolerated.  You may have sexual intercourse when it is comfortable.  Refrain from any heavy lifting or straining until approved by your doctor. °a. You may drive when you are no longer taking prescription pain medication, you can comfortably wear a seatbelt, and you can safely maneuver your car and apply brakes. °b. RETURN TO WORK:  __________________________________________________________ °9. You should see your doctor in the office for a follow-up appointment approximately 2-3 weeks after your surgery.  Make sure that you call for this appointment within a day or two after you arrive home to insure a convenient appointment time. °10. OTHER INSTRUCTIONS: __________________________________________________________________________________________________________________________ __________________________________________________________________________________________________________________________ °WHEN TO CALL YOUR DOCTOR: °1. Fever over 101.0 °2. Inability to urinate °3. Continued bleeding from incision. °4. Increased pain, redness, or drainage from the incision. °5. Increasing abdominal pain ° °The clinic staff is available to answer your questions during regular business hours.  Please don’t hesitate to call and ask to speak to one of the nurses for clinical concerns.  If you have a medical emergency, go to the nearest emergency room or call 911.  A surgeon from [facility] is always on call at the hospital. °[address], [address], [place], [place]  [address] ? [address] [address], [place], [place]   [address] °[phone] ? [phone] ? FAX [phone] °Web site:   [email or web address] °

## 2016-04-01 NOTE — Anesthesia Preprocedure Evaluation (Signed)
Anesthesia Evaluation  Patient identified by MRN, date of birth, ID band Patient awake    Reviewed: Allergy & Precautions, NPO status , Patient's Chart, lab work & pertinent test results  Airway Mallampati: I  TM Distance: >3 FB Neck ROM: Full    Dental   Pulmonary    Pulmonary exam normal        Cardiovascular Normal cardiovascular exam     Neuro/Psych    GI/Hepatic GERD  Medicated and Controlled,  Endo/Other    Renal/GU      Musculoskeletal   Abdominal   Peds  Hematology   Anesthesia Other Findings   Reproductive/Obstetrics                             Anesthesia Physical Anesthesia Plan  ASA: II  Anesthesia Plan: General   Post-op Pain Management:    Induction: Intravenous  Airway Management Planned: Oral ETT  Additional Equipment:   Intra-op Plan:   Post-operative Plan: Extubation in OR  Informed Consent: I have reviewed the patients History and Physical, chart, labs and discussed the procedure including the risks, benefits and alternatives for the proposed anesthesia with the patient or authorized representative who has indicated his/her understanding and acceptance.     Plan Discussed with: CRNA and Surgeon  Anesthesia Plan Comments:         Anesthesia Quick Evaluation

## 2016-04-01 NOTE — Interval H&P Note (Signed)
History and Physical Interval Note:  04/01/2016 10:41 AM  Tanya Webb  has presented today for surgery, with the diagnosis of UMBILICAL HERNIA, BILIARY COLIC  The various methods of treatment have been discussed with the patient and family. After consideration of risks, benefits and other options for treatment, the patient has consented to  Procedure(s): LAPAROSCOPIC CHOLECYSTECTOMY WITH INTRAOPERATIVE CHOLANGIOGRAM (N/A) HERNIA REPAIR UMBILICAL ADULT WITH MESH (N/A) INSERTION OF MESH (N/A) as a surgical intervention .  The patient's history has been reviewed, patient examined, no change in status, stable for surgery.  I have reviewed the patient's chart and labs.  Questions were answered to the patient's satisfaction.     [name] A.

## 2016-04-01 NOTE — Anesthesia Procedure Notes (Signed)
Procedure Name: Intubation Date/Time: 04/01/2016 10:59 AM Performed by: Montel Clock Pre-anesthesia Checklist: Patient identified, Emergency Drugs available, Suction available, Patient being monitored and Timeout performed Patient Re-evaluated:Patient Re-evaluated prior to inductionOxygen Delivery Method: Circle system utilized Preoxygenation: Pre-oxygenation with 100% oxygen Intubation Type: IV induction Ventilation: Mask ventilation without difficulty Laryngoscope Size: Mac and 3 Grade View: Grade I Tube type: Oral Tube size: 7.5 mm Number of attempts: 1 Airway Equipment and Method: Stylet Placement Confirmation: ETT inserted through vocal cords under direct vision,  positive ETCO2 and breath sounds checked- equal and bilateral Secured at: 21 cm Tube secured with: Tape Dental Injury: Teeth and Oropharynx as per pre-operative assessment

## 2016-04-01 NOTE — Transfer of Care (Signed)
Immediate Anesthesia Transfer of Care Note  Patient: Tanya Webb  Procedure(s) Performed: Procedure(s): LAPAROSCOPIC CHOLECYSTECTOMY WITH INTRAOPERATIVE CHOLANGIOGRAM (N/A) HERNIA REPAIR UMBILICAL ADULT (N/A)  Patient Location: PACU  Anesthesia Type:General  Level of Consciousness:  sedated, patient cooperative and responds to stimulation  Airway & Oxygen Therapy:Patient Spontanous Breathing and Patient connected to face mask oxgen  Post-op Assessment:  Report given to PACU RN and Post -op Vital signs reviewed and stable  Post vital signs:  Reviewed and stable  Last Vitals:  Vitals:   04/01/16 0921  BP: 132/89  Pulse: 91  Resp: 18  Temp: [id number] C    Complications: No apparent anesthesia complications

## 2016-04-01 NOTE — H&P (Signed)
Pre-op/Pre-procedure Orders Open    02/24/2016 [facility]  Erroll Luna, MD  General Surgery   Additional Documentation   Encounter Info:   Billing Info,   History,   Allergies,   Detailed Report     All Notes   H&P by Erroll Luna, MD    Author: Erroll Luna, MD Author Type: Physician Filed:   Note Status: Signed Cosign: Cosign Not Required Encounter Date:   Editor: Erroll Luna, MD (Physician)    Felton Clinton. To 02/24/2016 9:32 AM Location: [facility] Patient #: [id number] DOB: 06-17-1975 Married / Language: Cleophus Molt / Race: White Female  History of Present Illness Marcello Moores A. Ourania Hamler MD;   Patient words: GB   Patient presents for evaluation of abdominal pain. She was seen 9 days ago in the emergency room at [facility]. She's had a history of epigastric abdominal pain after eating. Greasy fatty spicy foods make it worse. Location is her upper abdomen and lower chest region. The pain is sharp associated with nausea and comes and goes. She has a history of a small umbilical hernia as well. Her pain became severe 9 days ago and she sought care initially at an urgent care and then subsequent emergency room visit. CT scan showed debris in the gallbladder consistent with either gallstones versus sludge. She also has a small umbilical hernia, a common bile duct that measured 1.3 cm with normal LFTs and right hydroureter. She comes in today to discuss her gallbladder issues and umbilical hernia. She has had no other flareups of pain like this but his had multiple milder flareups of similar pain after eating with this being the first severe episode.  The patient is a 40 year old female.   Other Problems [name], [name]; 3 AM) Gastroesophageal Reflux Disease Inguinal Hernia  Past Surgical History [name], [place] 9:33 AM) Oral Surgery  Diagnostic Studies History [name], [place];  Colonoscopy  never Mammogram 1-3 years ago Pap Smear 1-5 years ago  Allergies [name], [place] 9:33 AM) No Known Drug Allergies 02/24/2016  Medication History [name], [place];  9:33 AM) No Current Medications Medications Reconciled  Social History [name], [place]; 02/24/2016 9:33 AM) Alcohol use Occasional alcohol use. Caffeine use Coffee. No drug use Tobacco use Never smoker.  Family History [name], [place] 9:33 AM) Alcohol Abuse Brother, Father. Depression Mother. Diabetes Mellitus Brother, Father. Heart Disease Father. Respiratory Condition Mother, Sister.  Pregnancy / Birth History [name], [place]; 02/24/2016 9:33 AM) Age at menarche 79 years. Gravida 2 Length (months) of breastfeeding 3-6 Maternal age 87-35 Para 1 Regular periods     Review of Systems [name] [name];  9:33 AM) General Present- Appetite Loss and Weight Gain. Not Present- Chills, Fatigue, Fever, Night Sweats and Weight Loss. Skin Not Present- Change in Wart/Mole, Dryness, Hives, Jaundice, New Lesions, Non-Healing Wounds, Rash and Ulcer. HEENT Present- Wears glasses/contact lenses. Not Present- Earache, Hearing Loss, Hoarseness, Nose Bleed, Oral Ulcers, Ringing in the Ears, Seasonal Allergies, Sinus Pain, Sore Throat, Visual Disturbances and Yellow Eyes. Respiratory Not Present- Bloody sputum, Chronic Cough, Difficulty Breathing, Snoring and Wheezing. Cardiovascular Not Present- Chest Pain, Difficulty Breathing Lying Down, Leg Cramps, Palpitations, Rapid Heart Rate, Shortness of Breath and Swelling of Extremities. Gastrointestinal Present- Abdominal Pain, Bloating and Nausea. Not Present- Bloody Stool, Change in Bowel Habits, Chronic diarrhea, Constipation, Difficulty Swallowing, Excessive gas, Gets full quickly at meals, Hemorrhoids, Indigestion, Rectal Pain and Vomiting. Female Genitourinary  Not Present- Frequency, Nocturia, Painful Urination,  Pelvic Pain and Urgency. Musculoskeletal Not Present- Back Pain, Joint Pain, Joint Stiffness, Muscle Pain, Muscle Weakness and Swelling of Extremities. Neurological Not Present- Decreased Memory, Fainting, Headaches, Numbness, Seizures, Tingling, Tremor, Trouble walking and Weakness. Psychiatric Not Present- Anxiety, Bipolar, Change in Sleep Pattern, Depression, Fearful and Frequent crying. Endocrine Not Present- Cold Intolerance, Excessive Hunger, Hair Changes, Heat Intolerance, Hot flashes and New Diabetes. Hematology Not Present- Blood Thinners, Easy Bruising, Excessive bleeding, Gland problems, HIV and Persistent Infections.  Vitals [name] [name];  9:32 AM) 02/24/2016 9:32 AM Weight: 163.38 lb Height: 67in Body Surface Area: 1.86 m Body Mass Index: 25.59 kg/m  BP: 122/84 (Sitting, Left Arm, Standard)      Physical Exam (Winfred Redel A. Joselynn Amoroso MD;  12:28 PM)  General Mental Status-Alert. General Appearance-Consistent with stated age. Hydration-Well hydrated. Voice-Normal.  Head and Neck Head-normocephalic, atraumatic with no lesions or palpable masses.  Eye Eyeball - Bilateral-Extraocular movements intact. Sclera/Conjunctiva - Bilateral-No scleral icterus.  Chest and Lung Exam Chest and lung exam reveals -quiet, even and easy respiratory effort with no use of accessory muscles and on auscultation, normal breath sounds, no adventitious sounds and normal vocal resonance. Inspection Chest Wall - Normal. Back - normal.  Cardiovascular Cardiovascular examination reveals -on palpation PMI is normal in location and amplitude, no palpable S3 or S4. Normal cardiac borders., normal heart sounds, regular rate and rhythm with no murmurs, carotid auscultation reveals no bruits and normal pedal pulses bilaterally.  Abdomen Inspection Inspection of the abdomen reveals - No Hernias. Skin - Scar - no surgical  scars. Palpation/Percussion Palpation and Percussion of the abdomen reveal - Soft, Non Tender, No Rebound tenderness, No Rigidity (guarding) and No hepatosplenomegaly. Auscultation Auscultation of the abdomen reveals - Bowel sounds normal. Note: Small umbilical hernia reducible  Neurologic Neurologic evaluation reveals -alert and oriented x 3 with no impairment of recent or remote memory. Mental Status-Normal.  Musculoskeletal Normal Exam - Left-Upper Extremity Strength Normal and Lower Extremity Strength Normal. Normal Exam - Right-Upper Extremity Strength Normal, Lower Extremity Weakness.    Assessment & Plan (Catherine Oak A. Paula Zietz MD;  12:30 PM)  SYMPTOMATIC CHOLELITHIASIS (K80.20) Impression: Discussed nonoperative and operative means of treating her biliary colic. Discussed the pros and cons of laparoscopic cholecystectomy and the fact this will probably relieve her symptoms and 80% of cases. Discussed other etiologies of her abdominal pain. Discussed the umbilical hernia as well. The pros and cons of surgery were discussed with her and her husband at great length today after a long discussion of her symptoms and review of her tests. The procedure has been discussed with the patient. Risks of laparoscopic cholecystectomy include bleeding, infection, bile duct injury, leak, death, open surgery, diarrhea, other surgery, organ injury, blood vessel injury, DVT, and additional care.  Current Plans You are being scheduled for surgery - Our schedulers will call you.  You should hear from our office's scheduling department within 5 working days about the location, date, and time of surgery. We try to make accommodations for patient's preferences in scheduling surgery, but sometimes the OR schedule or the surgeon's schedule prevents [place] from making those accommodations.  If you have not heard from our office [phone]) in 5 working days, call the office and ask for your  surgeon's nurse.  If you have other questions about your diagnosis, plan, or surgery, call the office and ask for your surgeon's nurse.  Pt Education - Pamphlet Given - Laparoscopic Gallbladder Surgery: discussed with  patient and provided information. Written instructions provided The anatomy & physiology of hepatobiliary & pancreatic function was discussed. The pathophysiology of gallbladder dysfunction was discussed. Natural history risks without surgery was discussed. I feel the risks of no intervention will lead to serious problems that outweigh the operative risks; therefore, I recommended cholecystectomy to remove the pathology. I explained laparoscopic techniques with possible need for an open approach. Probable cholangiogram to evaluate the bilary tract was explained as well.  Risks such as bleeding, infection, abscess, leak, injury to other organs, need for further treatment, heart attack, death, and other risks were discussed. I noted a good likelihood this will help address the problem. Possibility that this will not correct all abdominal symptoms was explained. Goals of post-operative recovery were discussed as well. We will work to minimize complications. An educational handout further explaining the pathology and treatment options was given as well. Questions were answered. The patient expresses understanding & wishes to proceed with surgery.  Pt Education - CCS Laparosopic Post Op HCI (Gross) Pt Education - Laparoscopic Cholecystectomy: gallbladder UMBILICAL HERNIA WITHOUT OBSTRUCTION AND WITHOUT GANGRENE (K42.9) Impression: Recommend repair with primary closure since hernia defect at umbilical port site and this is very small. The risk of hernia repair include bleeding, infection, organ injury, bowel injury, bladder injury, nerve injury recurrent hernia, blood clots, worsening of underlying condition, chronic pain, mesh use, open surgery, death, and the need for other operattions. Pt  agrees to proceed  Current Plans The anatomy & physiology of the abdominal wall was discussed. The pathophysiology of hernias was discussed. Natural history risks without surgery including progeressive enlargement, pain, incarceration, & strangulation was discussed. Contributors to complications such as smoking, obesity, diabetes, prior surgery, etc were discussed.  I feel the risks of no intervention will lead to serious problems that outweigh the operative risks; therefore, I recommended surgery to reduce and repair the hernia. I explained laparoscopic techniques with possible need for an open approach. I noted the probable use of mesh to patch and/or buttress the hernia repair  Risks such as bleeding, infection, abscess, need for further treatment, heart attack, death, and other risks were discussed. I noted a good likelihood this will help address the problem. Goals of post-operative recovery were discussed as well. Possibility that this will not correct all symptoms was explained. I stressed the importance of low-impact activity, aggressive pain control, avoiding constipation, & not pushing through pain to minimize risk of post-operative chronic pain or injury. Possibility of reherniation especially with smoking, obesity, diabetes, immunosuppression, and other health conditions was discussed. We will work to minimize complications.  An educational handout further explaining the pathology & treatment options was given as well. Questions were answered. The patient expresses understanding & wishes to proceed with surgery.  Pt Education - Pamphlet Given - Hernia Surgery: discussed with patient and provided information. The anatomy & physiology of the abdominal wall and pelvic floor was discussed. The pathophysiology of hernias in the inguinal and pelvic region was discussed. Natural history risks such as progressive enlargement, pain, incarceration, and strangulation was discussed. Contributors to  complications such as smoking, obesity, diabetes, prior surgery, etc were discussed.  I feel the risks of no intervention will lead to serious problems that outweigh the operative risks; therefore, I recommended surgery to reduce and repair the hernia. I explained laparoscopic techniques with possible need for an open approach. I noted usual use of mesh to patch and/or buttress hernia repair  Risks such as bleeding, infection, abscess, need for further  treatment, heart attack, death, and other risks were discussed. I noted a good likelihood this will help address the problem. Goals of post-operative recovery were discussed as well. Possibility that this will not correct all symptoms was explained. I stressed the importance of low-impact activity, aggressive pain control, avoiding constipation, & not pushing through pain to minimize risk of post-operative chronic pain or injury. Possibility of reherniation was discussed. We will work to minimize complications.  An educational handout further explaining the pathology & treatment options was given as well. Questions were answered. The patient expresses understanding & wishes to proceed with surgery.  Pt Education - Consent for inguinal hernia - [place]: discussed with patient and provided information.

## 2016-04-01 NOTE — Op Note (Signed)
Laparoscopic Cholecystectomy with IOC Procedure Note Repair umbilical hernia   Indications: This patient presents with symptomatic gallbladder disease and will undergo laparoscopic cholecystectomy. The procedure has been discussed with the patient. Operative and non operative treatments have been discussed. Risks of surgery include bleeding, infection,  Common bile duct injury,  Injury to the stomach,liver, colon,small intestine, abdominal wall,  Diaphragm,  Major blood vessels,  And the need for an open procedure.  Other risks include worsening of medical problems, death,  DVT and pulmonary embolism, and cardiovascular events.   Medical options have also been discussed. The patient has been informed of long term expectations of surgery and non surgical options,  The patient agrees to proceed.    Pre-operative Diagnosis: Calculus of gallbladder without mention of cholecystitis or obstruction Umbilical hernia  Post-operative Diagnosis: Calculus of gallbladder without mention of cholecystitis or obstruction  Surgeon: Nahiara Kretzschmar A.   Assistants: OR staff  Anesthesia: General endotracheal anesthesia and Local anesthesia 0.5% bupivacaine, with epinephrine  ASA Class: 1  Procedure Details  The patient was seen again in the Holding Room. The risks, benefits, complications, treatment options, and expected outcomes were discussed with the patient. The possibilities of reaction to medication, pulmonary aspiration, perforation of viscus, bleeding, recurrent infection, finding a normal gallbladder, the need for additional procedures, failure to diagnose a condition, the possible need to convert to an open procedure, and creating a complication requiring transfusion or operation were discussed with the patient. The patient and/or family concurred with the proposed plan, giving informed consent. The site of surgery properly noted/marked. The patient was taken to Operating Room, identified as Tanya Webb  and the procedure verified as Laparoscopic Cholecystectomy with Intraoperative Cholangiograms. A Time Out was held and the above information confirmed.  Prior to the induction of general anesthesia, antibiotic prophylaxis was administered. General endotracheal anesthesia was then administered and tolerated well. After the induction, the abdomen was prepped in the usual sterile fashion. The patient was positioned in the supine position with the left arm comfortably tucked, along with some reverse Trendelenburg.  Local anesthetic agent was injected into the skin near the umbilicus and an incision made. The midline fascia was incised and the Hasson technique was used to introduce a 12 mm port under direct vision. It was secured with a figure of eight Vicryl suture placed in the usual fashion. Pneumoperitoneum was then created with CO2 and tolerated well without any adverse changes in the patient's vital signs. Additional trocars were introduced under direct vision with an 11 mm trocar in the epigastrium and 2 5 mm trocars in the right upper quadrant. All skin incisions were infiltrated with a local anesthetic agent before making the incision and placing the trocars.   The gallbladder was identified, the fundus grasped and retracted cephalad. Adhesions were lysed bluntly and with the electrocautery where indicated, taking care not to injure any adjacent organs or viscus. The infundibulum was grasped and retracted laterally, exposing the peritoneum overlying the triangle of Calot. This was then divided and exposed in a blunt fashion. The cystic duct was clearly identified and bluntly dissected circumferentially. The junctions of the gallbladder, cystic duct and common bile duct were clearly identified prior to the division of any linear structure.   An incision was made in the cystic duct and the cholangiogram catheter introduced. The catheter was secured using an endoclip. The study showed no stones and good  visualization of the distal and proximal biliary tree.  The CBD was dilated but no stones were  noted. The catheter was then removed.   The cystic duct was then  ligated with surgical clips  on the patient side and  clipped on the gallbladder side and divided. The cystic artery was identified, dissected free, ligated with clips and divided as well. Posterior cystic artery clipped and divided.  The gallbladder was dissected from the liver bed in retrograde fashion with the electrocautery. The gallbladder was removed. The liver bed was irrigated and inspected. Hemostasis was achieved with the electrocautery. Copious irrigation was utilized and was repeatedly aspirated until clear all particulate matter. Hemostasis was achieved with no signs  Of bleeding or bile leakage.  There was bleeding from the umbilical port site in the  peritoneal  fat controlled with cautery and harmonic scalpel.  Pneumoperitoneum was completely reduced after viewing removal of the trocars under direct vision. The wound was thoroughly irrigated and the fascia was then closed with a figure of eight suture; the skin was then closed with 4 O monocryl  and a sterile dressing was applied.  Instrument, sponge, and needle counts were correct at closure and at the conclusion of the case.   Findings:  with Cholelithiasis  Estimated Blood Loss: less than 50 mL         Drains: drain         Total IV Fluids: 800 mL         Specimens: Gallbladder           Complications: None; patient tolerated the procedure well.         Disposition: PACU - hemodynamically stable.         Condition: stable

## 2016-04-01 NOTE — Progress Notes (Signed)
Pt states pain score  Is 6  But requests not ob have narcotic requesting to have ibuproten 800mg  consulted with dr Conrad Bonsall order obtained and ibupreten 800mg  given po

## 2016-04-02 ENCOUNTER — Encounter (HOSPITAL_COMMUNITY): Payer: Self-pay | Admitting: Surgery

## 2016-04-02 NOTE — Anesthesia Postprocedure Evaluation (Signed)
Anesthesia Post Note  Patient: Tanya Webb  Procedure(s) Performed: Procedure(s) (LRB): LAPAROSCOPIC CHOLECYSTECTOMY WITH INTRAOPERATIVE CHOLANGIOGRAM (N/A) HERNIA REPAIR UMBILICAL ADULT (N/A)  Patient location during evaluation: PACU Anesthesia Type: General Level of consciousness: awake and alert Pain management: pain level controlled Vital Signs Assessment: post-procedure vital signs reviewed and stable Respiratory status: spontaneous breathing, nonlabored ventilation, respiratory function stable and patient connected to nasal cannula oxygen Cardiovascular status: blood pressure returned to baseline and stable Postop Assessment: no signs of nausea or vomiting Anesthetic complications: no    Last Vitals:  Vitals:   04/01/16 1315 04/01/16 1330  BP:  137/87  Pulse: 74 64  Resp: 14 16  Temp:  36.5 C    Last Pain:  Vitals:   04/01/16 1330  TempSrc:   PainSc: 6                  Celsa Nordahl DAVID

## 2016-05-01 HISTORY — PX: CHOLECYSTECTOMY: SHX55

## 2016-07-09 ENCOUNTER — Telehealth: Payer: Self-pay | Admitting: Family Medicine

## 2016-07-09 NOTE — Telephone Encounter (Signed)
Pt would like to have x-rays orders sent to [facility] instead. ep

## 2016-07-10 ENCOUNTER — Encounter: Payer: Self-pay | Admitting: *Deleted

## 2016-07-10 NOTE — Telephone Encounter (Signed)
Pt says yes, the chest x-ray from 10-18. ep

## 2016-07-10 NOTE — Telephone Encounter (Signed)
Tried to call patient but phone rang and then cut off.  Will try sending a mychart message.   I need to know which xray she is referring to.  Is it the chest xray order that Dr. Kennon Rounds placed in 03/2016?  Jazmin Hartsell,[name]

## 2016-07-10 NOTE — Telephone Encounter (Signed)
Order faxed and sent patient a mychart message letting her know this. Jazmin Hartsell,[name]

## 2016-11-18 ENCOUNTER — Encounter: Payer: Self-pay | Admitting: Family Medicine

## 2016-11-18 ENCOUNTER — Ambulatory Visit (INDEPENDENT_AMBULATORY_CARE_PROVIDER_SITE_OTHER): Payer: BLUE CROSS/BLUE SHIELD | Admitting: Family Medicine

## 2016-11-18 ENCOUNTER — Other Ambulatory Visit (HOSPITAL_COMMUNITY)
Admission: RE | Admit: 2016-11-18 | Discharge: 2016-11-18 | Disposition: A | Discharge status: Home / Self Care | Payer: BLUE CROSS/BLUE SHIELD | Source: Ambulatory Visit | Attending: Family Medicine | Admitting: Family Medicine

## 2016-11-18 VITALS — BP 110/72 | HR 111 | Temp 99.0°F | Ht 67.0 in | Wt 161.0 lb

## 2016-11-18 DIAGNOSIS — Z01411 Encounter for gynecological examination (general) (routine) with abnormal findings: Secondary | ICD-10-CM | POA: Insufficient documentation

## 2016-11-18 NOTE — Patient Instructions (Signed)
Preventive Care 18-39 Years, Female Preventive care refers to lifestyle choices and visits with your health care provider that can promote health and wellness. What does preventive care include?  A yearly physical exam. This is also called an annual well check.  Dental exams once or twice a year.  Routine eye exams. Ask your health care provider how often you should have your eyes checked.  Personal lifestyle choices, including: ? Daily care of your teeth and gums. ? Regular physical activity. ? Eating a healthy diet. ? Avoiding tobacco and drug use. ? Limiting alcohol use. ? Practicing safe sex. ? Taking vitamin and mineral supplements as recommended by your health care provider. What happens during an annual well check? The services and screenings done by your health care provider during your annual well check will depend on your age, overall health, lifestyle risk factors, and family history of disease. Counseling Your health care provider may ask you questions about your:  Alcohol use.  Tobacco use.  Drug use.  Emotional well-being.  Home and relationship well-being.  Sexual activity.  Eating habits.  Work and work Statistician.  Method of birth control.  Menstrual cycle.  Pregnancy history.  Screening You may have the following tests or measurements:  Height, weight, and BMI.  Diabetes screening. This is done by checking your blood sugar (glucose) after you have not eaten for a while (fasting).  Blood pressure.  Lipid and cholesterol levels. These may be checked every 5 years starting at age 66.  Skin check.  Hepatitis C blood test.  Hepatitis B blood test.  Sexually transmitted disease (STD) testing.  BRCA-related cancer screening. This may be done if you have a family history of breast, ovarian, tubal, or peritoneal cancers.  Pelvic exam and Pap test. This may be done every 3 years starting at age 40. Starting at age 59, this may be done every 5  years if you have a Pap test in combination with an HPV test.  Discuss your test results, treatment options, and if necessary, the need for more tests with your health care provider. Vaccines Your health care provider may recommend certain vaccines, such as:  Influenza vaccine. This is recommended every year.  Tetanus, diphtheria, and acellular pertussis (Tdap, Td) vaccine. You may need a Td booster every 10 years.  Varicella vaccine. You may need this if you have not been vaccinated.  HPV vaccine. If you are 69 or younger, you may need three doses over 6 months.  Measles, mumps, and rubella (MMR) vaccine. You may need at least one dose of MMR. You may also need a second dose.  Pneumococcal 13-valent conjugate (PCV13) vaccine. You may need this if you have certain conditions and were not previously vaccinated.  Pneumococcal polysaccharide (PPSV23) vaccine. You may need one or two doses if you smoke cigarettes or if you have certain conditions.  Meningococcal vaccine. One dose is recommended if you are age 27-21 years and a first-year college student living in a residence hall, or if you have one of several medical conditions. You may also need additional booster doses.  Hepatitis A vaccine. You may need this if you have certain conditions or if you travel or work in places where you may be exposed to hepatitis A.  Hepatitis B vaccine. You may need this if you have certain conditions or if you travel or work in places where you may be exposed to hepatitis B.  Haemophilus influenzae type b (Hib) vaccine. You may need this if  you have certain risk factors.  Talk to your health care provider about which screenings and vaccines you need and how often you need them. This information is not intended to replace advice given to you by your health care provider. Make sure you discuss any questions you have with your health care provider. Document Released: 07/14/2001 Document Revised: 02/05/2016  Document Reviewed: 03/19/2015 Elsevier Interactive Patient Education  2017 [employer].

## 2016-11-18 NOTE — Progress Notes (Signed)
    Subjective:     Tanya Webb is a 41 y.o. female and is here for a comprehensive physical exam. The patient reports problems - has recent abnormal cycle.Gall bladder removed  Kidney stone removed Period lasted x 14 days on May 31st--on no contraception. Also reports right sided pelvic pain.  Social History   Social History  . Marital status: Married    Spouse name: N/A  . Number of children: N/A  . Years of education: N/A   Occupational History  . Not on file.   Social History Main Topics  . Smoking status: Never Smoker  . Smokeless tobacco: Never Used  . Alcohol use Yes     Comment: weekly  . Drug use: No  . Sexual activity: Not on file   Other Topics Concern  . Not on file   Social History Narrative  . No narrative on file   Health Maintenance  Topic Date Due  . PAP SMEAR  12/30/2004  . INFLUENZA VACCINE  03/05/2017 (Originally 12/30/2016)  . TETANUS/TDAP  02/02/2018  . HIV Screening  Completed    The following portions of the patient's history were reviewed and updated as appropriate: allergies, current medications, past family history, past medical history, past social history, past surgical history and problem list.  Review of Systems Pertinent items noted in HPI and remainder of comprehensive ROS otherwise negative.   Objective:    BP 110/72   Pulse (!) 111   Temp 99 F (37.2 C) (Oral)   Ht 5\' 7"  (1.702 m)   Wt 161 lb (73 kg)   LMP 10/29/2016 (Exact Date)   SpO2 99%   BMI 25.22 kg/m  General appearance: alert, cooperative and appears stated age Head: Normocephalic, without obvious abnormality, atraumatic Neck: no adenopathy, supple, symmetrical, trachea midline and thyroid not enlarged, symmetric, no tenderness/mass/nodules Back: symmetric, no curvature. ROM normal. No CVA tenderness. Lungs: clear to auscultation bilaterally Breasts: normal appearance, no masses or tenderness Heart: regular rate and rhythm, S1, S2 normal, no murmur, click, rub  or gallop Abdomen: soft, non-tender; bowel sounds normal; no masses,  no organomegaly Pelvic: cervix normal in appearance, external genitalia normal, no adnexal masses or tenderness, no cervical motion tenderness, uterus normal size, shape, and consistency and vagina normal without discharge Extremities: extremities normal, atraumatic, no cyanosis or edema Pulses: 2+ and symmetric Skin: Skin color, texture, turgor normal. No rashes or lesions Lymph nodes: Cervical, supraclavicular, and axillary nodes normal. Neurologic: Grossly normal    Assessment:    Healthy female exam.      Plan:  Encounter for gynecological examination with abnormal finding - Plan: TSH, Follicle stimulating hormone, CBC, Lipid panel, Cytology - PAP  With one time strange period, would not pursue work-up. Next due in 1 wk, if not present, check UPT. If period is strange again, consider pelvic sonogram. If RLQ pain persists (non-tender pelvic) consider pelvic sonogram.   See After Visit Summary for Counseling Recommendations

## 2016-11-19 ENCOUNTER — Encounter: Payer: Self-pay | Admitting: Family Medicine

## 2016-11-19 LAB — CBC
HEMOGLOBIN: 12.2 g/dL (ref 11.1–15.9)
Hematocrit: 38.1 % (ref 34.0–46.6)
MCH: 28 pg (ref 26.6–33.0)
MCHC: 32 g/dL (ref 31.5–35.7)
MCV: 88 fL (ref 79–97)
Platelets: 278 10*3/uL (ref 150–379)
RBC: 4.35 x10E6/uL (ref 3.77–5.28)
RDW: 15 % (ref 12.3–15.4)
WBC: 6.2 10*3/uL (ref 3.4–10.8)

## 2016-11-19 LAB — FOLLICLE STIMULATING HORMONE: FSH: 6.1 m[IU]/mL

## 2016-11-19 LAB — LIPID PANEL
CHOLESTEROL TOTAL: 188 mg/dL (ref 100–199)
Chol/HDL Ratio: 3.2 ratio (ref 0.0–4.4)
HDL: 58 mg/dL (ref >39)
LDL Calculated: 97 mg/dL (ref 0–99)
TRIGLYCERIDES: 163 mg/dL — AB (ref 0–149)
VLDL Cholesterol Cal: 33 mg/dL (ref 5–40)

## 2016-11-19 LAB — TSH: TSH: 1.9 u[IU]/mL (ref 0.450–4.500)

## 2016-11-20 LAB — CYTOLOGY - PAP
Adequacy: ABSENT
Diagnosis: NEGATIVE
HPV: NOT DETECTED

## 2016-11-20 NOTE — Addendum Note (Signed)
Addended by: [place] Pink on: 11/20/2016 02:07 PM   Modules accepted: Orders

## 2016-11-23 LAB — BETA HCG QUANT (REF LAB): hCG Quant: <1 m[IU]/mL

## 2016-11-23 LAB — SPECIMEN STATUS REPORT

## 2017-01-22 ENCOUNTER — Telehealth: Payer: Self-pay | Admitting: *Deleted

## 2017-01-22 MED ORDER — MEGESTROL ACETATE 40 MG PO TABS: 40.0000 mg | ORAL_TABLET | Freq: Two times a day (BID) | ORAL | 3 refills | Status: DC

## 2017-01-22 NOTE — Telephone Encounter (Signed)
Patient calls nurse line.  She is having a very heavy period ( states that Dr. Blane Ohara know this is in her history).  She is going thru a super plus tampon every hour.  Wants to know if Dr. Kennon Rounds can  Call her in something.  Will forward to MD.  [place], Salome Spotted, [place]

## 2017-01-22 NOTE — Telephone Encounter (Signed)
LMOVM informing pt of medication.  Masahiro Iglesia, Salome Spotted, [name]

## 2017-02-24 ENCOUNTER — Ambulatory Visit (INDEPENDENT_AMBULATORY_CARE_PROVIDER_SITE_OTHER): Payer: BLUE CROSS/BLUE SHIELD | Admitting: Family Medicine

## 2017-02-24 ENCOUNTER — Encounter: Payer: Self-pay | Admitting: Family Medicine

## 2017-02-24 VITALS — BP 120/64 | HR 113 | Temp 98.3°F | Ht 67.0 in | Wt 158.0 lb

## 2017-02-24 DIAGNOSIS — N920 Excessive and frequent menstruation with regular cycle: Secondary | ICD-10-CM | POA: Diagnosis not present

## 2017-02-24 NOTE — Progress Notes (Signed)
   Subjective:    Patient ID: Tanya Webb is a 41 y.o. female presenting with Menstrual Problem  on 02/24/2017  HPI: Patient with long h/o menorrhagia with heavy cycles. Last visit we did blood work and it was all normal. Had heavy cycle in August and took Megace. Heavy cycle stopped. Restarted 9/5 and has noted that she is having some bleeding every day. Not heavy bleeding. Previous GYN instructed her to have a hysterectomy, which she does not want. OC's and IUD were effective at managing her symptoms in the past.  Review of Systems  Constitutional: Negative for chills and fever.  Respiratory: Negative for shortness of breath.   Cardiovascular: Negative for chest pain.  Gastrointestinal: Negative for abdominal pain, nausea and vomiting.  Genitourinary: Positive for vaginal bleeding. Negative for dysuria.  Skin: Negative for rash.      Objective:    BP 120/64   Pulse (!) 113   Temp 98.3 F (36.8 C) (Oral)   Ht 5\' 7"  (1.702 m)   Wt 158 lb (71.7 kg)   LMP 01/21/2017 Comment: ongoing spotting since then  SpO2 99%   BMI 24.75 kg/m  Physical Exam  Constitutional: She is oriented to person, place, and time. She appears well-developed and well-nourished. No distress.  HENT:  Head: Normocephalic and atraumatic.  Eyes: No scleral icterus.  Neck: Neck supple.  Cardiovascular: Normal rate.   Pulmonary/Chest: Effort normal.  Abdominal: Soft.  Neurological: She is alert and oriented to person, place, and time.  Skin: Skin is warm and dry.  Psychiatric: She has a normal mood and affect.      Assessment & Plan:   Problem List Items Addressed This Visit      Unprioritized   MENORRHAGIA - Primary    Given long standing issue, will check pelvic sono--if normal consider IUD with EMB (given age). Choices of continued medication, endometrial ablation and hysterectomy were all reviewed as were the costs, risks, and benefits of them all. After careful consideration, she opts for office  IUD with EMB.      Relevant Orders   US PELVIC COMPLETE WITH TRANSVAGINAL    May use Megace as needed for cycle control.  Total face-to-face time with patient: 25 minutes. Over 50% of encounter was spent on counseling and coordination of care. Return in about 4 weeks (around 03/24/2017) for IUD insertion and endometrial biopsy.  Donnamae Jude 02/24/2017 2:53 PM

## 2017-02-24 NOTE — Patient Instructions (Signed)
Dysfunctional Uterine Bleeding °Dysfunctional uterine bleeding is abnormal bleeding from the uterus. Dysfunctional uterine bleeding includes: °· A period that comes earlier or later than usual. °· A period that is lighter, heavier, or has blood clots. °· Bleeding between periods. °· Skipping one or more periods. °· Bleeding after sexual intercourse. °· Bleeding after menopause. ° °Follow these instructions at home: °Pay attention to any changes in your symptoms. Follow these instructions to help with your condition: °Eating and drinking °· Eat well-balanced meals. Include foods that are high in iron, such as liver, meat, shellfish, green leafy vegetables, and eggs. °· If you become constipated: °? Drink plenty of water. °? Eat fruits and vegetables that are high in water and fiber, such as spinach, carrots, raspberries, apples, and mango. °Medicines °· Take over-the-counter and prescription medicines only as told by your health care provider. °· Do not change medicines without talking with your health care provider. °· Aspirin or medicines that contain aspirin may make the bleeding worse. Do not take those medicines: °? During the week before your period. °? During your period. °· If you were prescribed iron pills, take them as told by your health care provider. Iron pills help to replace iron that your body loses because of this condition. °Activity °· If you need to change your sanitary pad or tampon more than one time every 2 hours: °? Lie in bed with your feet raised (elevated). °? Place a cold pack on your lower abdomen. °? Rest as much as possible until the bleeding stops or slows down. °· Do not try to lose weight until the bleeding has stopped and your blood iron level is back to normal. °Other Instructions °· For two months, write down: °? When your period starts. °? When your period ends. °? When any abnormal bleeding occurs. °? What problems you notice. °· Keep all follow up visits as told by your health  care provider. This is important. °Contact a health care provider if: °· You get light-headed or weak. °· You have nausea and vomiting. °· You cannot eat or drink without vomiting. °· You feel dizzy or have diarrhea while you are taking medicines. °· You are taking birth control pills or hormones, and you want to change them or stop taking them. °Get help right away if: °· You develop a fever or chills. °· You need to change your sanitary pad or tampon more than one time per hour. °· Your bleeding becomes heavier, or your flow contains clots more often. °· You develop pain in your abdomen. °· You lose consciousness. °· You develop a rash. °This information is not intended to replace advice given to you by your health care provider. Make sure you discuss any questions you have with your health care provider. °Document Released: 05/15/2000 Document Revised: 10/24/2015 Document Reviewed: 08/13/2014 °Elsevier Interactive Patient Education © 2018 Elsevier Inc. ° °

## 2017-02-24 NOTE — Assessment & Plan Note (Signed)
Given long standing issue, will check pelvic sono--if normal consider IUD with EMB (given age). Choices of continued medication, endometrial ablation and hysterectomy were all reviewed as were the costs, risks, and benefits of them all. After careful consideration, she opts for office IUD with EMB.

## 2017-03-03 ENCOUNTER — Ambulatory Visit (HOSPITAL_COMMUNITY)
Admission: RE | Admit: 2017-03-03 | Discharge: 2017-03-03 | Disposition: A | Discharge status: Home / Self Care | Payer: BLUE CROSS/BLUE SHIELD | Source: Ambulatory Visit | Attending: Family Medicine | Admitting: Family Medicine

## 2017-03-03 DIAGNOSIS — D259 Leiomyoma of uterus, unspecified: Secondary | ICD-10-CM | POA: Insufficient documentation

## 2017-03-03 DIAGNOSIS — N920 Excessive and frequent menstruation with regular cycle: Secondary | ICD-10-CM

## 2017-03-04 ENCOUNTER — Encounter: Payer: Self-pay | Admitting: Family Medicine

## 2017-03-04 ENCOUNTER — Ambulatory Visit (INDEPENDENT_AMBULATORY_CARE_PROVIDER_SITE_OTHER): Payer: BLUE CROSS/BLUE SHIELD | Admitting: Family Medicine

## 2017-03-04 VITALS — BP 118/70 | HR 119 | Temp 98.8°F | Ht 67.0 in | Wt 159.0 lb

## 2017-03-04 DIAGNOSIS — D251 Intramural leiomyoma of uterus: Secondary | ICD-10-CM | POA: Diagnosis not present

## 2017-03-04 DIAGNOSIS — D250 Submucous leiomyoma of uterus: Secondary | ICD-10-CM | POA: Diagnosis not present

## 2017-03-04 DIAGNOSIS — D259 Leiomyoma of uterus, unspecified: Secondary | ICD-10-CM | POA: Insufficient documentation

## 2017-03-04 DIAGNOSIS — N920 Excessive and frequent menstruation with regular cycle: Secondary | ICD-10-CM | POA: Diagnosis not present

## 2017-03-04 NOTE — Assessment & Plan Note (Signed)
Treated with Megace prn. For more definitive treatment.

## 2017-03-04 NOTE — Assessment & Plan Note (Signed)
Desires in office endometrial ablation with in office anesthesia--will refer to group in town. Understands possible limitations and possible need for hysterectomy if unsuccessful.

## 2017-03-04 NOTE — Progress Notes (Signed)
Subjective:    Patient ID: Tanya Webb is a 41 y.o. female presenting with Follow-up (imaging, possible IUD insertion)  on 03/04/2017  HPI: Patient with long h/o menorrhagia with heavy cycles. Nml TSH and FSH. Had heavy cycle in August and took Megace. Heavy cycle stopped. Restarted 9/5 and has noted that Tanya Webb is having some bleeding every day. Not heavy bleeding. Previous GYN instructed her to have a hysterectomy, which Tanya Webb does not want. OC's and IUD were effective at managing her symptoms in the past. Tanya Webb has undergone pelvic sono which shows multiple fibroids, the larges of which is 6 cm and fundally located and possibly one submucosal. Tanya Webb had previously agreed to IUD in office with endometrial sampling, but given these new findings, prefers endometrial ablation in office with sedation.  Review of Systems  Constitutional: Negative for chills and fever.  Respiratory: Negative for shortness of breath.   Cardiovascular: Negative for chest pain.  Gastrointestinal: Negative for abdominal pain, nausea and vomiting.  Genitourinary: Negative for dysuria.  Skin: Negative for rash.      Objective:    BP 118/70   Pulse (!) 119   Temp 98.8 F (37.1 C) (Oral)   Ht 5\' 7"  (1.702 m)   Wt 159 lb (72.1 kg)   SpO2 99%   BMI 24.90 kg/m  Physical Exam  Constitutional: Tanya Webb is oriented to person, place, and time. Tanya Webb appears well-developed and well-nourished. No distress.  HENT:  Head: Normocephalic and atraumatic.  Eyes: No scleral icterus.  Neck: Neck supple.  Cardiovascular: Normal rate.   Pulmonary/Chest: Effort normal.  Abdominal: Soft.  Neurological: Tanya Webb is alert and oriented to person, place, and time.  Skin: Skin is warm and dry.  Psychiatric: Tanya Webb has a normal mood and affect.   Labs from 11/06/16 CBC    Component Value Date/Time   WBC 6.2 11/18/2016 1221   WBC 5.0 03/27/2016 0941   RBC 4.35 11/18/2016 1221   RBC 4.24 03/27/2016 0941   HGB 12.2 11/18/2016 1221   HCT 38.1  11/18/2016 1221   PLT 278 11/18/2016 1221   MCV 88 11/18/2016 1221   MCH 28.0 11/18/2016 1221   MCH 30.2 03/27/2016 0941   MCHC 32.0 11/18/2016 1221   MCHC 33.1 03/27/2016 0941   RDW 15.0 11/18/2016 1221   LYMPHSABS 1.5 03/27/2016 0941   MONOABS 0.5 03/27/2016 0941   EOSABS 0.1 03/27/2016 0941   BASOSABS 0.1 03/27/2016 0941   TSH 1.900   FSH 6.1      PELVIC U/S 03/03/2017 Uterus Measurements: 10.1 x 6.0 x 10.6 cm. Several uterine fibroids are seen within the uterine corpus and fundus, largest in the right fundal region measuring 6.0 cm in maximum diameter.  Endometrium Thickness: 8 mm. At least 1 fibroid in the fundal area shows mass effect on the endometrium which is displaced anteriorly.  Right ovary Measurements: 3.9 x 2.3 x 1.5 cm. Normal appearance/no adnexal mass.  Left ovary Measurements: 3.9 x 2.7 x 3.4 cm. Normal appearance/no adnexal mass.  IMPRESSION: Multiple uterine fibroids, largest measuring approximately 6 cm. Endometrial thickness measures 8 mm. Normal appearance of both ovaries.    Assessment & Plan:   Problem List Items Addressed This Visit      Unprioritized   MENORRHAGIA - Primary    Treated with Megace prn. For more definitive treatment.      Relevant Orders   Ambulatory referral to Obstetrics / Gynecology   Fibroid uterus    Desires in office endometrial ablation with  in office anesthesia--will refer to group in town. Understands possible limitations and possible need for hysterectomy if unsuccessful.      Relevant Orders   Ambulatory referral to Obstetrics / Gynecology      Total face-to-face time with patient: 10 minutes. Over 50% of encounter was spent on counseling and coordination of care. Return if symptoms worsen or fail to improve.  Donnamae Jude 03/04/2017 3:50 PM

## 2017-03-04 NOTE — Patient Instructions (Signed)
Endometrial Ablation Endometrial ablation is a procedure that destroys the thin inner layer of the lining of the uterus (endometrium). This procedure may be done:  To stop heavy periods.  To stop bleeding that is causing anemia.  To control irregular bleeding.  To treat bleeding caused by small tumors (fibroids) in the endometrium.  This procedure is often an alternative to major surgery, such as removal of the uterus and cervix (hysterectomy). As a result of this procedure:  You may not be able to have children. However, if you are premenopausal (you have not gone through menopause): ? You may still have a small chance of getting pregnant. ? You will need to use a reliable method of birth control after the procedure to prevent pregnancy.  You may stop having a menstrual period, or you may have only a small amount of bleeding during your period. Menstruation may return several years after the procedure.  Tell a health care provider about:  Any allergies you have.  All medicines you are taking, including vitamins, herbs, eye drops, creams, and over-the-counter medicines.  Any problems you or family members have had with the use of anesthetic medicines.  Any blood disorders you have.  Any surgeries you have had.  Any medical conditions you have. What are the risks? Generally, this is a safe procedure. However, problems may occur, including:  A hole (perforation) in the uterus or bowel.  Infection of the uterus, bladder, or vagina.  Bleeding.  Damage to other structures or organs.  An air bubble in the lung (air embolus).  Problems with pregnancy after the procedure.  Failure of the procedure.  Decreased ability to diagnose cancer in the endometrium.  What happens before the procedure?  You will have tests of your endometrium to make sure there are no pre-cancerous cells or cancer cells present.  You may have an ultrasound of the uterus.  You may be given  medicines to thin the endometrium.  Ask your health care provider about: ? Changing or stopping your regular medicines. This is especially important if you take diabetes medicines or blood thinners. ? Taking medicines such as aspirin and ibuprofen. These medicines can thin your blood. Do not take these medicines before your procedure if your doctor tells you not to.  Plan to have someone take you home from the hospital or clinic. What happens during the procedure?  You will lie on an exam table with your feet and legs supported as in a pelvic exam.  To lower your risk of infection: ? Your health care team will wash or sanitize their hands and put on germ-free (sterile) gloves. ? Your genital area will be washed with soap.  An IV tube will be inserted into one of your veins.  You will be given a medicine to help you relax (sedative).  A surgical instrument with a light and camera (resectoscope) will be inserted into your vagina and moved into your uterus. This allows your surgeon to see inside your uterus.  Endometrial tissue will be removed using one of the following methods: ? Radiofrequency. This method uses a radiofrequency-alternating electric current to remove the endometrium. ? Cryotherapy. This method uses extreme cold to freeze the endometrium. ? Heated-free liquid. This method uses a heated saltwater (saline) solution to remove the endometrium. ? Microwave. This method uses high-energy microwaves to heat up the endometrium and remove it. ? Thermal balloon. This method involves inserting a catheter with a balloon tip into the uterus. The balloon tip is   filled with heated fluid to remove the endometrium. The procedure may vary among health care providers and hospitals. What happens after the procedure?  Your blood pressure, heart rate, breathing rate, and blood oxygen level will be monitored until the medicines you were given have worn off.  As tissue healing occurs, you may  notice vaginal bleeding for 4-6 weeks after the procedure. You may also experience: ? Cramps. ? Thin, watery vaginal discharge that is light pink or brown in color. ? A need to urinate more frequently than usual. ? Nausea.  Do not drive for 24 hours if you were given a sedative.  Do not have sex or insert anything into your vagina until your health care provider approves. Summary  Endometrial ablation is done to treat the many causes of heavy menstrual bleeding.  The procedure may be done only after medications have been tried to control the bleeding.  Plan to have someone take you home from the hospital or clinic. This information is not intended to replace advice given to you by your health care provider. Make sure you discuss any questions you have with your health care provider. Document Released: 03/27/2004 Document Revised: 06/04/2016 Document Reviewed: 06/04/2016 Elsevier Interactive Patient Education  2017 [employer]  

## 2017-04-13 ENCOUNTER — Telehealth: Payer: Self-pay | Admitting: *Deleted

## 2017-04-13 DIAGNOSIS — N3000 Acute cystitis without hematuria: Secondary | ICD-10-CM

## 2017-04-13 MED ORDER — SULFAMETHOXAZOLE-TRIMETHOPRIM 800-160 MG PO TABS: 1.0000 | ORAL_TABLET | Freq: Two times a day (BID) | ORAL | 0 refills | Status: DC

## 2017-04-13 NOTE — Telephone Encounter (Signed)
Patient left message on nurse line stating she believes she is getting a UTI from her kidney stones, having flank pain and frequent urination. Patient states Dr. Kennon Rounds is very familiar with her issues with her kidney stones and would like to know is she would be willing to call her in an antibiotic.

## 2017-04-13 NOTE — Telephone Encounter (Signed)
Left message on voicemail informing patient of message from MD.

## 2017-04-13 NOTE — Telephone Encounter (Signed)
Will forward to MD to advise. Hildagarde Holleran,[name]  

## 2017-06-28 ENCOUNTER — Telehealth: Payer: Self-pay

## 2017-06-28 NOTE — Telephone Encounter (Signed)
Patient left message on nurse line stating she will be traveling to [place] for a week and asks that Dr. Kennon Rounds refill the Diamox she gave her for altitude sickness or let her know if there are other options for altitude sickness. Danley Danker, RN [facility] RN)

## 2017-06-29 MED ORDER — ACETAZOLAMIDE 125 MG PO TABS: 125.0000 mg | ORAL_TABLET | Freq: Two times a day (BID) | ORAL | 0 refills | Status: DC

## 2017-06-29 NOTE — Telephone Encounter (Signed)
Patient aware. Tanya Raphael, RN ([facility] RN)  

## 2017-11-21 IMAGING — US US PELVIS COMPLETE TRANSABD/TRANSVAG
1 series · 15 of 25 positions shown · non-contrast
Comparison: CT on 07/03/2016

CLINICAL DATA: Menorrhagia.  Fibroids.

EXAM:
TRANSABDOMINAL AND TRANSVAGINAL ULTRASOUND OF PELVIS
TECHNIQUE: Both transabdominal and transvaginal ultrasound examinations of the
pelvis were performed. Transabdominal technique was performed for
global imaging of the pelvis including uterus, ovaries, adnexal
regions, and pelvic cul-de-sac. It was necessary to proceed with
endovaginal exam following the transabdominal exam to visualize the
endometrium and ovaries.

[Series 1: us pelvis complete transabd/transvag · 15 of 106 slices shown]
[im 1/106]
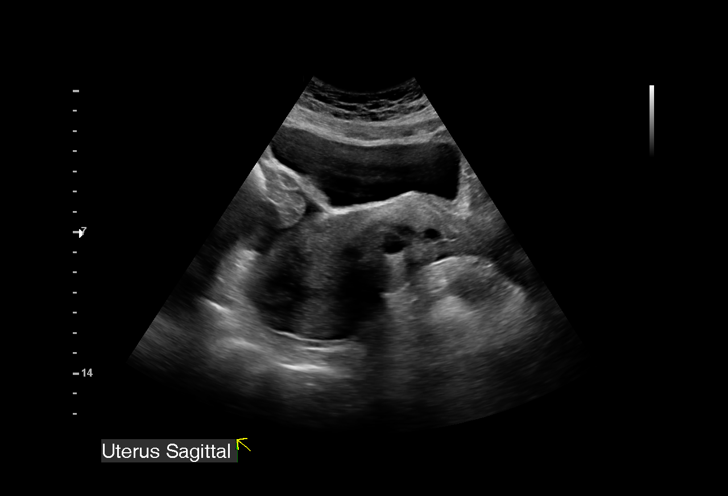
[im 9/106]
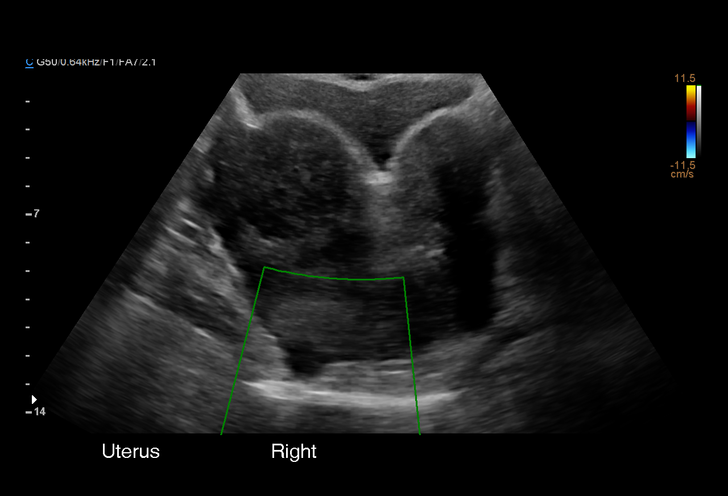
[im 18/106]
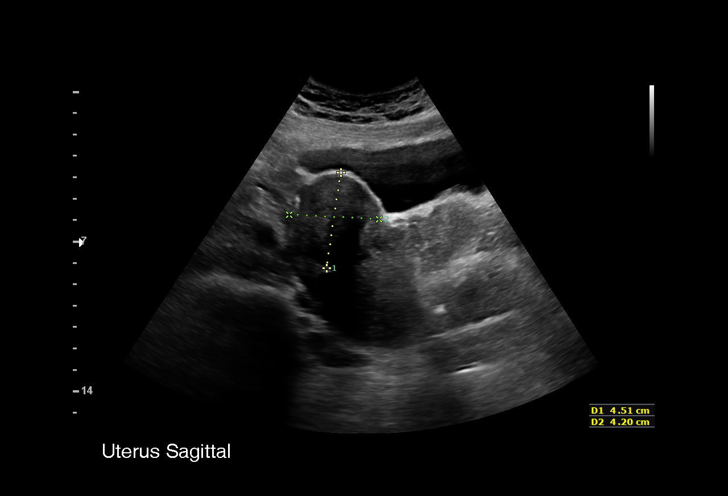
[im 22/106]
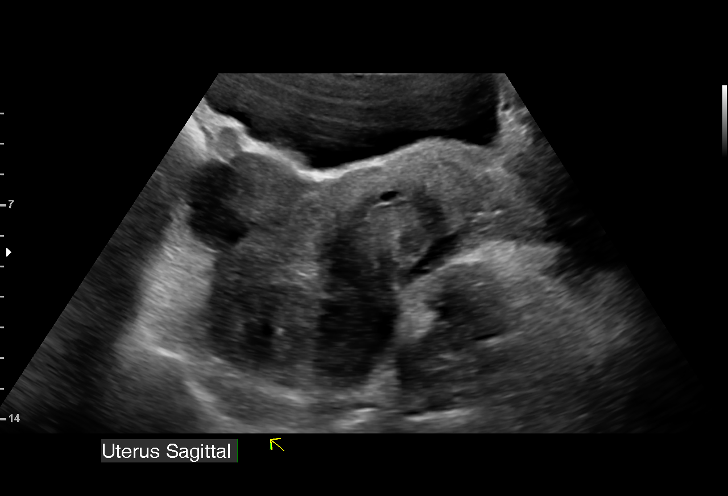
[im 31/106]
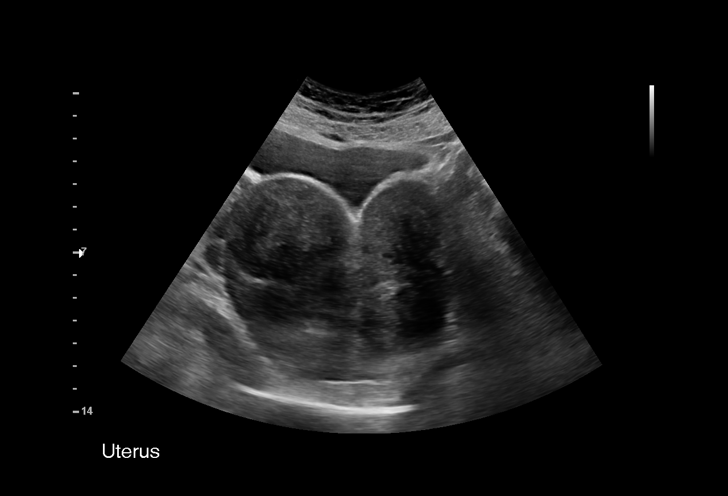
[im 40/106]
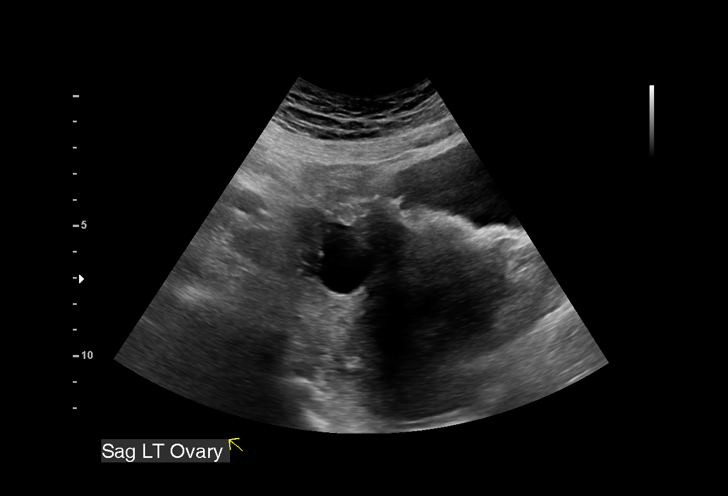
[im 44/106]
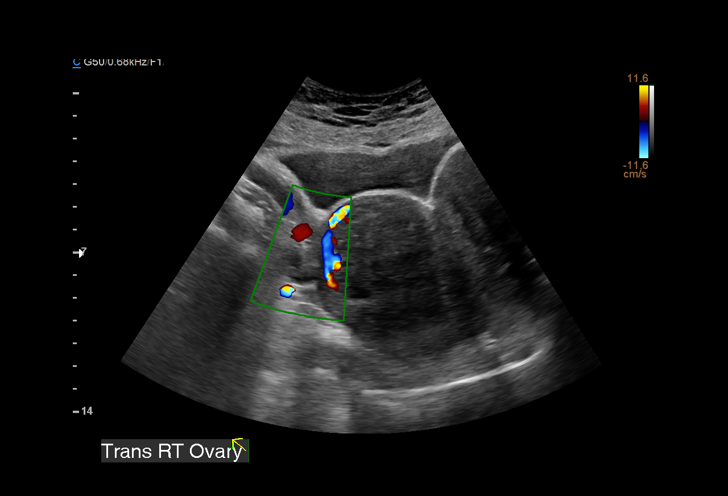
[im 53/106]
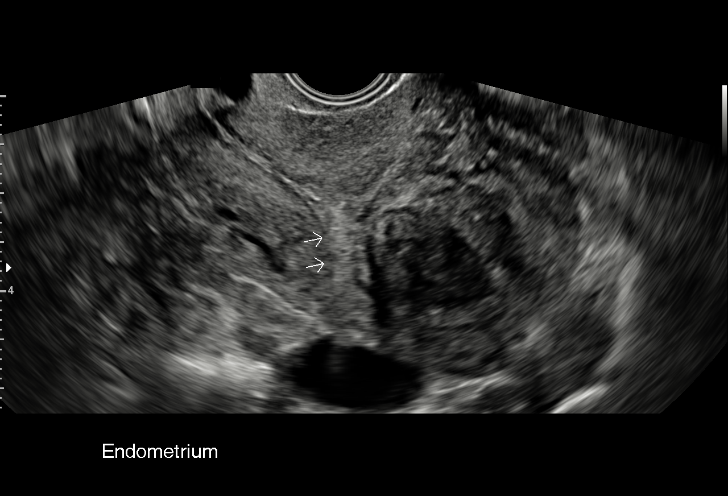
[im 62/106]
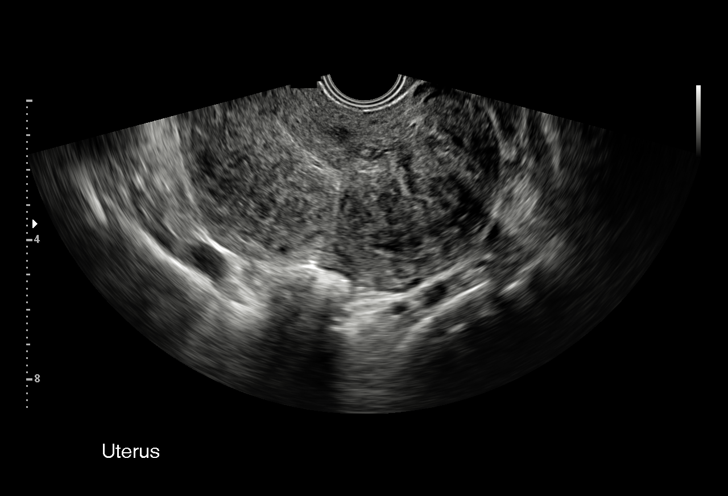
[im 66/106]
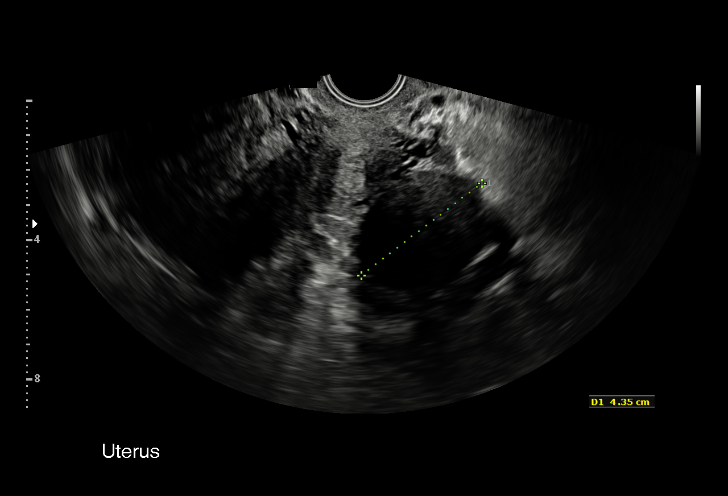
[im 75/106]
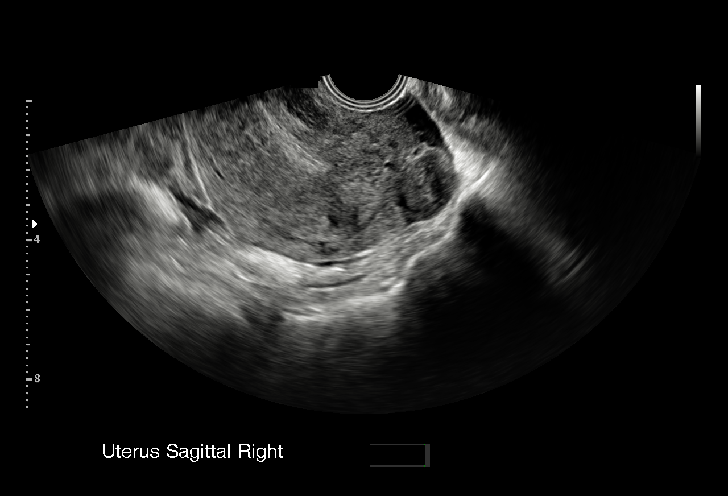
[im 84/106]
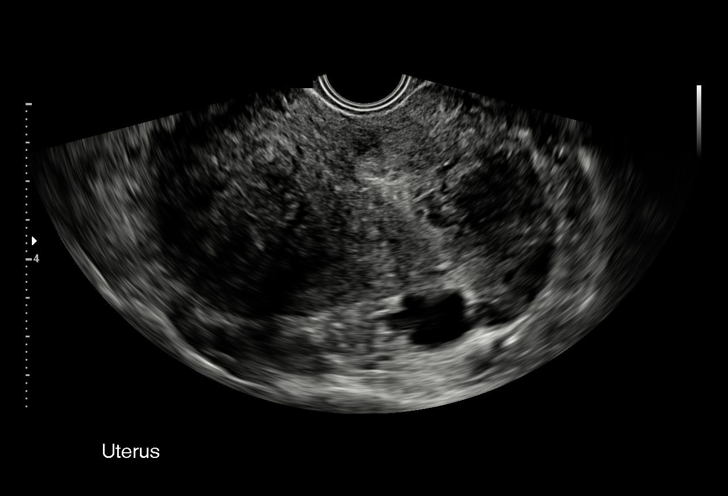
[im 88/106]
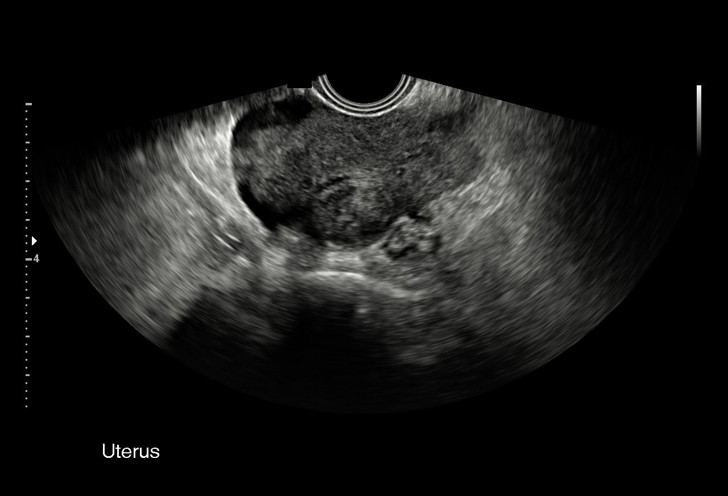
[im 97/106]
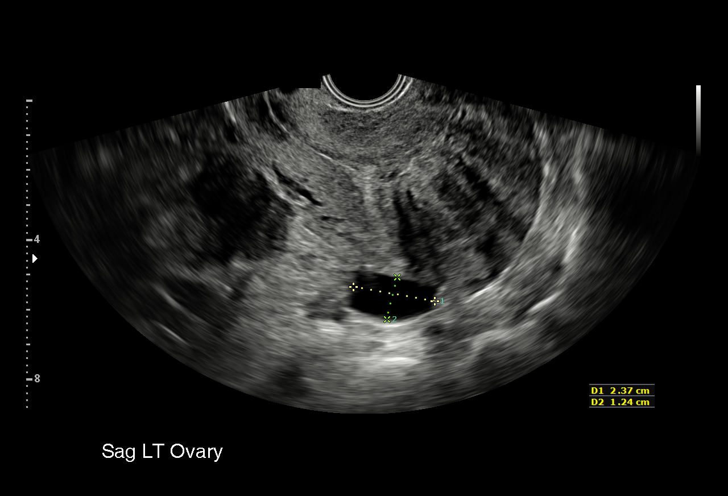
[im 106/106]
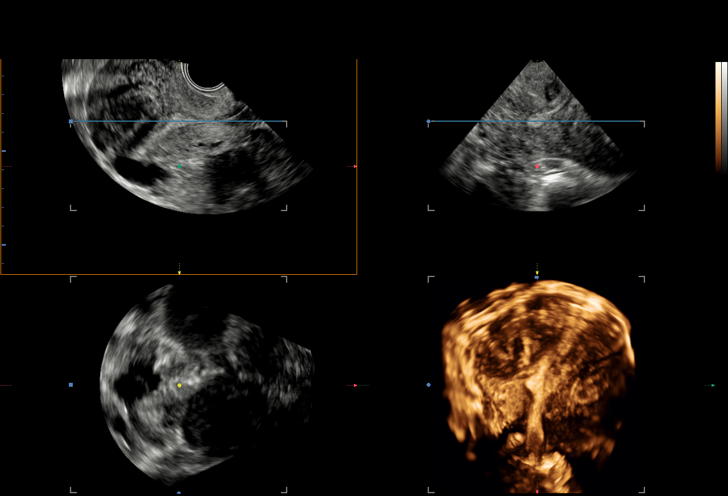

[15 of 25 positions shown; findings below may reference images not displayed]

FINDINGS: Uterus

Measurements: 10.1 x 6.0 x 10.6 cm. Several uterine fibroids are
seen within the uterine corpus and fundus, largest in the right
fundal region measuring 6.0 cm in maximum diameter.

Endometrium

Thickness: 8 mm. At least 1 fibroid in the fundal area shows mass
effect on the endometrium which is displaced anteriorly.

Right ovary

Measurements: 3.9 x 2.3 x 1.5 cm. Normal appearance/no adnexal mass.

Left ovary

Measurements: 3.9 x 2.7 x 3.4 cm. Normal appearance/no adnexal mass.

Other findings

No abnormal free fluid.
IMPRESSION: Multiple uterine fibroids, largest measuring approximately 6 cm.

Endometrial thickness measures 8 mm.

Normal appearance of both ovaries.

## 2018-02-25 DIAGNOSIS — B360 Pityriasis versicolor: Secondary | ICD-10-CM | POA: Diagnosis not present

## 2018-02-25 DIAGNOSIS — L700 Acne vulgaris: Secondary | ICD-10-CM | POA: Diagnosis not present

## 2018-02-25 DIAGNOSIS — L814 Other melanin hyperpigmentation: Secondary | ICD-10-CM | POA: Diagnosis not present

## 2018-03-07 DIAGNOSIS — Z1231 Encounter for screening mammogram for malignant neoplasm of breast: Secondary | ICD-10-CM | POA: Diagnosis not present

## 2018-03-07 DIAGNOSIS — Z01419 Encounter for gynecological examination (general) (routine) without abnormal findings: Secondary | ICD-10-CM | POA: Diagnosis not present

## 2018-03-07 DIAGNOSIS — Z1151 Encounter for screening for human papillomavirus (HPV): Secondary | ICD-10-CM | POA: Diagnosis not present

## 2018-03-07 DIAGNOSIS — Z6827 Body mass index (BMI) 27.0-27.9, adult: Secondary | ICD-10-CM | POA: Diagnosis not present

## 2019-01-17 ENCOUNTER — Encounter: Payer: Self-pay | Admitting: Family Medicine

## 2019-01-17 ENCOUNTER — Ambulatory Visit (INDEPENDENT_AMBULATORY_CARE_PROVIDER_SITE_OTHER): Payer: 59 | Admitting: Family Medicine

## 2019-01-17 ENCOUNTER — Other Ambulatory Visit: Payer: Self-pay

## 2019-01-17 VITALS — BP 132/90 | HR 127 | Wt 172.0 lb

## 2019-01-17 DIAGNOSIS — Z0000 Encounter for general adult medical examination without abnormal findings: Secondary | ICD-10-CM | POA: Diagnosis not present

## 2019-01-17 DIAGNOSIS — L989 Disorder of the skin and subcutaneous tissue, unspecified: Secondary | ICD-10-CM

## 2019-01-17 DIAGNOSIS — F339 Major depressive disorder, recurrent, unspecified: Secondary | ICD-10-CM | POA: Diagnosis not present

## 2019-01-17 DIAGNOSIS — N920 Excessive and frequent menstruation with regular cycle: Secondary | ICD-10-CM

## 2019-01-17 NOTE — Assessment & Plan Note (Signed)
Note given to return to gym.

## 2019-01-17 NOTE — Assessment & Plan Note (Signed)
Has IUD in place--working ok

## 2019-01-17 NOTE — Patient Instructions (Signed)
Health Maintenance, Female Adopting a healthy lifestyle and getting preventive care are important in promoting health and wellness. Ask your health care provider about:  The right schedule for you to have regular tests and exams.  Things you can do on your own to prevent diseases and keep yourself healthy. What should I know about diet, weight, and exercise? Eat a healthy diet   Eat a diet that includes plenty of vegetables, fruits, low-fat dairy products, and lean protein.  Do not eat a lot of foods that are high in solid fats, added sugars, or sodium. Maintain a healthy weight Body mass index (BMI) is used to identify weight problems. It estimates body fat based on height and weight. Your health care provider can help determine your BMI and help you achieve or maintain a healthy weight. Get regular exercise Get regular exercise. This is one of the most important things you can do for your health. Most adults should:  Exercise for at least 150 minutes each week. The exercise should increase your heart rate and make you sweat (moderate-intensity exercise).  Do strengthening exercises at least twice a week. This is in addition to the moderate-intensity exercise.  Spend less time sitting. Even light physical activity can be beneficial. Watch cholesterol and blood lipids Have your blood tested for lipids and cholesterol at 45 years of age, then have this test every 5 years. Have your cholesterol levels checked more often if:  Your lipid or cholesterol levels are high.  You are older than 45 years of age.  You are at high risk for heart disease. What should I know about cancer screening? Depending on your health history and family history, you may need to have cancer screening at various ages. This may include screening for:  Breast cancer.  Cervical cancer.  Colorectal cancer.  Skin cancer.  Lung cancer. What should I know about heart disease, diabetes, and high blood  pressure? Blood pressure and heart disease  High blood pressure causes heart disease and increases the risk of stroke. This is more likely to develop in people who have high blood pressure readings, are of African descent, or are overweight.  Have your blood pressure checked: ? Every 3-5 years if you are 18-39 years of age. ? Every year if you are 40 years old or older. Diabetes Have regular diabetes screenings. This checks your fasting blood sugar level. Have the screening done:  Once every three years after age 40 if you are at a normal weight and have a low risk for diabetes.  More often and at a younger age if you are overweight or have a high risk for diabetes. What should I know about preventing infection? Hepatitis B If you have a higher risk for hepatitis B, you should be screened for this virus. Talk with your health care provider to find out if you are at risk for hepatitis B infection. Hepatitis C Testing is recommended for:  Everyone born from 1945 through 1965.  Anyone with known risk factors for hepatitis C. Sexually transmitted infections (STIs)  Get screened for STIs, including gonorrhea and chlamydia, if: ? You are sexually active and are younger than 45 years of age. ? You are older than 45 years of age and your health care provider tells you that you are at risk for this type of infection. ? Your sexual activity has changed since you were last screened, and you are at increased risk for chlamydia or gonorrhea. Ask your health care provider if   you are at risk.  Ask your health care provider about whether you are at high risk for HIV. Your health care provider may recommend a prescription medicine to help prevent HIV infection. If you choose to take medicine to prevent HIV, you should first get tested for HIV. You should then be tested every 3 months for as long as you are taking the medicine. Pregnancy  If you are about to stop having your period (premenopausal) and  you may become pregnant, seek counseling before you get pregnant.  Take 400 to 800 micrograms (mcg) of folic acid every day if you become pregnant.  Ask for birth control (contraception) if you want to prevent pregnancy. Osteoporosis and menopause Osteoporosis is a disease in which the bones lose minerals and strength with aging. This can result in bone fractures. If you are 65 years old or older, or if you are at risk for osteoporosis and fractures, ask your health care provider if you should:  Be screened for bone loss.  Take a calcium or vitamin D supplement to lower your risk of fractures.  Be given hormone replacement therapy (HRT) to treat symptoms of menopause. Follow these instructions at home: Lifestyle  Do not use any products that contain nicotine or tobacco, such as cigarettes, e-cigarettes, and chewing tobacco. If you need help quitting, ask your health care provider.  Do not use street drugs.  Do not share needles.  Ask your health care provider for help if you need support or information about quitting drugs. Alcohol use  Do not drink alcohol if: ? Your health care provider tells you not to drink. ? You are pregnant, may be pregnant, or are planning to become pregnant.  If you drink alcohol: ? Limit how much you use to 0-1 drink a day. ? Limit intake if you are breastfeeding.  Be aware of how much alcohol is in your drink. In the [place] one drink equals one 12 oz bottle of beer (355 mL), one 5 oz glass of wine (148 mL), or one 1 oz glass of hard liquor (44 mL). General instructions  Schedule regular health, dental, and eye exams.  Stay current with your vaccines.  Tell your health care provider if: ? You often feel depressed. ? You have ever been abused or do not feel safe at home. Summary  Adopting a healthy lifestyle and getting preventive care are important in promoting health and wellness.  Follow your health care provider's instructions about healthy  diet, exercising, and getting tested or screened for diseases.  Follow your health care provider's instructions on monitoring your cholesterol and blood pressure. This information is not intended to replace advice given to you by your health care provider. Make sure you discuss any questions you have with your health care provider. Document Released: 12/01/2010 Document Revised: 05/11/2018 Document Reviewed: 05/11/2018 Elsevier Patient Education  2020 Elsevier Inc.  

## 2019-01-17 NOTE — Progress Notes (Signed)
   Subjective:    Patient ID: Tanya Webb is a 43 y.o. female presenting with Annual Exam  on 01/17/2019  HPI: Here for annual check up. No new issues. Has gained some weight due to the inability to work out. This has caused increasing anxiety, depression and mood swings. She would like a note to return to the gym. Also with lesion on arm, that will not go away, thickens up and peels off.  Review of Systems  Constitutional: Negative for chills and fever.  Respiratory: Negative for shortness of breath.   Cardiovascular: Negative for chest pain.  Gastrointestinal: Negative for abdominal pain, nausea and vomiting.  Genitourinary: Negative for dysuria.  Skin: Negative for rash.      Objective:    BP 132/90   Pulse (!) 127   Wt 172 lb (78 kg)   LMP 12/26/2018 (Exact Date)   SpO2 97%   BMI 26.94 kg/m  Physical Exam Constitutional:      General: She is not in acute distress.    Appearance: She is well-developed.  HENT:     Head: Normocephalic and atraumatic.  Eyes:     General: No scleral icterus. Neck:     Musculoskeletal: Neck supple.  Cardiovascular:     Rate and Rhythm: Normal rate and regular rhythm.     Heart sounds: No murmur.  Pulmonary:     Effort: Pulmonary effort is normal.  Abdominal:     Palpations: Abdomen is soft. There is no mass.     Tenderness: There is no abdominal tenderness.  Musculoskeletal:        General: No swelling.  Skin:    General: Skin is warm and dry.     Comments: Small nodule on the back of the left forearm, with some thickened dry skin noted.  Neurological:     Mental Status: She is alert and oriented to person, place, and time.        Assessment & Plan:   Problem List Items Addressed This Visit      Unprioritized   Depression, recurrent ([place])    Note given to return to gym.      MENORRHAGIA    Has IUD in place--working ok       Other Visit Diagnoses    Routine general medical examination at a health care facility    -   Primary   maintenance labs, and mammogram--pap due 1 year.   Relevant Orders   CBC   Comprehensive metabolic panel   Hemoglobin Q1J   TSH   Follicle stimulating hormone   VITAMIN D 25 Hydroxy (Vit-D Deficiency, Fractures)   Lipid panel   MM DIGITAL SCREENING BILATERAL   Skin lesion       May be actinic keratosis--advised to have derm look at.      Return in 1 year (on 01/17/2020).  Donnamae Jude 01/17/2019 5:13 PM

## 2019-01-18 LAB — COMPREHENSIVE METABOLIC PANEL
ALT: 17 IU/L (ref 0–32)
AST: 25 IU/L (ref 0–40)
Albumin/Globulin Ratio: 1.8 (ref 1.2–2.2)
Albumin: 4.4 g/dL (ref 3.8–4.8)
Alkaline Phosphatase: 50 IU/L (ref 39–117)
BUN/Creatinine Ratio: 14 (ref 9–23)
BUN: 10 mg/dL (ref 6–24)
Bilirubin Total: <0.2 mg/dL (ref 0.0–1.2)
CO2: 23 mmol/L (ref 20–29)
Calcium: 9.5 mg/dL (ref 8.7–10.2)
Chloride: 100 mmol/L (ref 96–106)
Creatinine, Ser: 0.74 mg/dL (ref 0.57–1.00)
GFR calc Af Amer: 113 mL/min/{1.73_m2} (ref >59)
GFR calc non Af Amer: 98 mL/min/{1.73_m2} (ref >59)
Globulin, Total: 2.4 g/dL (ref 1.5–4.5)
Glucose: 116 mg/dL — ABNORMAL HIGH (ref 65–99)
Potassium: 4 mmol/L (ref 3.5–5.2)
Sodium: 136 mmol/L (ref 134–144)
Total Protein: 6.8 g/dL (ref 6.0–8.5)

## 2019-01-18 LAB — CBC
Hematocrit: 39.6 % (ref 34.0–46.6)
Hemoglobin: 13.4 g/dL (ref 11.1–15.9)
MCH: 31.3 pg (ref 26.6–33.0)
MCHC: 33.8 g/dL (ref 31.5–35.7)
MCV: 93 fL (ref 79–97)
Platelets: 260 10*3/uL (ref 150–450)
RBC: 4.28 x10E6/uL (ref 3.77–5.28)
RDW: 13.2 % (ref 11.7–15.4)
WBC: 5.5 10*3/uL (ref 3.4–10.8)

## 2019-01-18 LAB — HEMOGLOBIN A1C
Est. average glucose Bld gHb Est-mCnc: 97 mg/dL
Hgb A1c MFr Bld: 5 % (ref 4.8–5.6)

## 2019-01-18 LAB — LIPID PANEL
Chol/HDL Ratio: 3.5 ratio (ref 0.0–4.4)
Cholesterol, Total: 173 mg/dL (ref 100–199)
HDL: 49 mg/dL (ref >39)
LDL Calculated: 59 mg/dL (ref 0–99)
Triglycerides: 323 mg/dL — ABNORMAL HIGH (ref 0–149)
VLDL Cholesterol Cal: 65 mg/dL — ABNORMAL HIGH (ref 5–40)

## 2019-01-18 LAB — FOLLICLE STIMULATING HORMONE: FSH: 1.6 m[IU]/mL

## 2019-01-18 LAB — TSH: TSH: 1.81 u[IU]/mL (ref 0.450–4.500)

## 2019-01-18 LAB — VITAMIN D 25 HYDROXY (VIT D DEFICIENCY, FRACTURES): Vit D, 25-Hydroxy: 40.8 ng/mL (ref 30.0–100.0)

## 2019-08-14 ENCOUNTER — Telehealth: Payer: Self-pay

## 2019-08-14 NOTE — Telephone Encounter (Signed)
Patient calls nurse line requesting rx for Prozac. Patient has not previously been on this medication. Patient reports discussing depression at last office visit with Dr. Kennon Rounds in August 2020. They had made plan to use exercise as means of managing depression, however, patient reports that this is not working. Patient would like to discuss medication management of depression. Patient requested virtual visit as she lives in [place]. Virtual visit scheduled on 3/23 with Dr. Erin Hearing.   To PCP  Talbot Grumbling, RN

## 2019-08-22 ENCOUNTER — Encounter: Payer: Self-pay | Admitting: Family Medicine

## 2019-08-22 ENCOUNTER — Other Ambulatory Visit: Payer: Self-pay

## 2019-08-22 ENCOUNTER — Telehealth (INDEPENDENT_AMBULATORY_CARE_PROVIDER_SITE_OTHER): Payer: 59 | Admitting: Family Medicine

## 2019-08-22 DIAGNOSIS — F411 Generalized anxiety disorder: Secondary | ICD-10-CM

## 2019-08-22 DIAGNOSIS — B009 Herpesviral infection, unspecified: Secondary | ICD-10-CM | POA: Diagnosis not present

## 2019-08-22 MED ORDER — FLUOXETINE HCL 20 MG PO TABS: 20.0000 mg | ORAL_TABLET | Freq: Every day | ORAL | 3 refills | Status: DC

## 2019-08-22 MED ORDER — VALACYCLOVIR HCL 1 G PO TABS: 2000.0000 mg | ORAL_TABLET | Freq: Two times a day (BID) | ORAL | 1 refills | Status: AC

## 2019-08-22 NOTE — Assessment & Plan Note (Signed)
Worsened.  No much of a depression component.  Decided to start prozac as has worked for her in past.  Discussed when to take and possible side effects.  She will my chart me in a week or sooner if problems

## 2019-08-22 NOTE — Assessment & Plan Note (Signed)
Recurrent cold sores.  Will prescribe valacyclovir

## 2019-08-22 NOTE — Progress Notes (Signed)
[place] Telemedicine Visit  Patient consented to have virtual visit. Method of visit: Telephone  Encounter participants: Patient: Tanya Webb - located at home Provider: Lind Covert - located at office Others (if applicable): no  Chief Complaint: Anxiety  Has has anxiety on and off for years.  Taken prozaz and wellbutrin in the past but may years ago.  Feels for last year is worsening with becoming more angry and worrying more and more tired.  Sleep and appetite are ok and no suicidal ideation.  Has been gaining weight.  Continues to exercise regularly.  Has seen counselor in past but not interested now but does want to consider meditation  COLD SORES Having more outbreaks in last year.  Has taken valacyclovir in past.    No fevers or weight loss or adenopathy  Alcohol 2-3 per day with more recently.  No other drugs   Pertinent PMHx: no chronic illnesses  Works as Museum/gallery exhibitions officer, husband and 2 children 3 and 27  Husband not working   Exam:  Respiratory: normal Psych:  Cognition and judgment appear intact. Alert, communicative  and cooperative with normal attention span and concentration. No apparent delusions, illusions, hallucinations   Assessment/Plan:  Anxiety state Worsened.  No much of a depression component.  Decided to start prozac as has worked for her in past.  Discussed when to take and possible side effects.  She will my chart me in a week or sooner if problems   Herpes simplex virus (HSV) infection Recurrent cold sores.  Will prescribe valacyclovir     Time spent during visit with patient: 20 minutes

## 2019-11-13 ENCOUNTER — Ambulatory Visit: Payer: Self-pay | Admitting: Surgery

## 2019-11-13 DIAGNOSIS — K432 Incisional hernia without obstruction or gangrene: Secondary | ICD-10-CM | POA: Diagnosis not present

## 2019-11-13 NOTE — H&P (Signed)
[name] Appointment: 11/13/2019 10:10 AM Location: [facility] Patient #: [id number] DOB: 08/27/1975 Married / Language: Tanya Webb / Race: White Female  History of Present Illness Tanya Moores [name] MD; 11/13/2019 12:36 PM) Patient words: Patient returns for follow-up after she noticed about 6 weeks ago a bulge above her previous umbilical incision. In 2017 she had a laparoscopic cholecystectomy and a small umbilical hernia that was repaired primarily. The last 6 weeks had a bulge above her umbilicus. It is soft and reducible. She's not having any pain or change in bowel or bladder function.  The patient is a 44 year old female.   Past Surgical History Tanya Webb, [place]; 11/13/2019 10:34 AM) Gallbladder Surgery - Laparoscopic  Diagnostic Studies History (Tanya Webb, Webb; 11/13/2019 10:34 AM) Colonoscopy never Mammogram within last year 1-3 years ago Pap Smear 1-5 years ago  Allergies (Tanya Webb, Webb; 11/13/2019 10:35 AM) Tanya Webb *NUTRIENTS* Allergies Reconciled No Known Drug Allergies [11/13/2019]: (Marked as Inactive)  Medication History (Tanya Webb, Webb; 11/13/2019 10:35 AM) FLUoxetine HCl (20MG  Tablet, Oral) Active. Allegra (Oral) Specific strength Tanya - Active. Multivitamin Adult (Oral) Active. Medications Reconciled  Social History Tanya Webb, Webb; 11/13/2019 10:35 AM) Alcohol use Occasional alcohol use. Caffeine use Coffee. No drug use Tobacco use Never smoker.  Family History (Tanya Webb, Tanya Webb; 11/13/2019 10:34 AM) Alcohol Abuse Brother, Father. Depression Father, Mother. Diabetes Mellitus Brother, Father. Heart Disease Father. Respiratory Condition Mother, Sister.  Pregnancy / Birth History Tanya Webb, [place]; 11/13/2019 10:35 AM) Age at menarche 57 years. Contraceptive History Intrauterine device. Gravida 2 Length (months) of breastfeeding 3-6 Maternal age 33-35 Para 1 Regular periods  Other  Problems (Tanya Webb, Webb; 11/13/2019 10:34 AM) Anxiety Disorder Cholelithiasis Depression Kidney Stone Umbilical Hernia Repair     Review of Systems (Tanya Webb; 11/13/2019 10:35 AM) General Not Present- Appetite Loss, Chills, Fatigue, Fever, Night Sweats, Weight Gain and Weight Loss. Skin Not Present- Change in Wart/Mole, Dryness, Hives, Jaundice, New Lesions, Non-Healing Wounds, Rash and Ulcer. HEENT Not Present- Earache, Hearing Loss, Hoarseness, Nose Bleed, Oral Ulcers, Ringing in the Ears, Seasonal Allergies, Sinus Pain, Sore Throat, Visual Disturbances, Wears glasses/contact lenses and Yellow Eyes. Respiratory Not Present- Bloody sputum, Chronic Cough, Difficulty Breathing, Snoring and Wheezing. Breast Not Present- Breast Mass, Breast Pain, Nipple Discharge and Skin Changes. Cardiovascular Not Present- Chest Pain, Difficulty Breathing Lying Down, Leg Cramps, Palpitations, Rapid Heart Rate, Shortness of Breath and Swelling of Extremities. Gastrointestinal Not Present- Abdominal Pain, Bloating, Bloody Stool, Change in Bowel Habits, Chronic diarrhea, Constipation, Difficulty Swallowing, Excessive gas, Gets full quickly at meals, Hemorrhoids, Indigestion, Nausea, Rectal Pain and Vomiting. Female Genitourinary Not Present- Frequency, Nocturia, Painful Urination, Pelvic Pain and Urgency. Musculoskeletal Not Present- Back Pain, Joint Pain, Joint Stiffness, Muscle Pain, Muscle Weakness and Swelling of Extremities. Neurological Not Present- Decreased Memory, Fainting, Headaches, Numbness, Seizures, Tingling, Tremor, Trouble walking and Weakness. Psychiatric Not Present- Anxiety, Bipolar, Change in Sleep Pattern, Depression, Fearful and Frequent crying. Endocrine Not Present- Cold Intolerance, Excessive Hunger, Hair Changes, Heat Intolerance, Hot flashes and New Diabetes. Hematology Not Present- Blood Thinners, Easy Bruising, Excessive bleeding, Gland problems, HIV and Persistent  Infections.  Vitals (Tanya Webb; 11/13/2019 10:35 AM) 11/13/2019 10:35 AM Weight: 174 lb Height: 67in Body Surface Area: 1.91 m Body Mass Index: 27.25 kg/m  Temp.: 98.4F  Pulse: 115 (Regular)  BP: 124/76(Sitting, Left Arm, Standard)        Physical Exam (Tanya Bawa [name] MD; 11/13/2019 12:37 PM)  General Mental Status-Alert. General  Appearance-Consistent with stated age. Hydration-Well hydrated. Voice-Normal.  Chest and Lung Exam Chest and lung exam reveals -quiet, even and easy respiratory effort with no use of accessory muscles and on auscultation, normal breath sounds, no adventitious sounds and normal vocal resonance. Inspection Chest Wall - Normal. Back - normal.  Cardiovascular Cardiovascular examination reveals -normal heart sounds, regular rate and rhythm with no murmurs and normal pedal pulses bilaterally.  Abdomen Note: Soft nontender. Laparoscopic scars noted. Superior to umbilicus is a 3 cm reducible bulge consistent with ventral hernia which appears separate from the umbilicus.    Assessment & Plan (Tanya [name] MD; 11/13/2019 12:38 PM)  VENTRAL INCISIONAL HERNIA WITHOUT OBSTRUCTION OR GANGRENE (K43.2) Impression: Discussed laparoscopic and open approaches with mesh. She is opted for a laparoscopic repair of ventral hernia with mesh. The risk of hernia repair include bleeding, infection, organ injury, bowel injury, bladder injury, nerve injury recurrent hernia, blood clots, worsening of underlying condition, chronic pain, mesh use, open surgery, death, and the need for other operations. Pt agrees to proceed  total time 30 minutes  Current Plans You are being scheduled for surgery- Our schedulers will call you.  You should hear from our office's scheduling department within 5 working days about the location, date, and time of surgery. We try to make accommodations for patient's preferences in scheduling surgery, but  sometimes the OR schedule or the surgeon's schedule prevents [place] from making those accommodations.  If you have not heard from our office [phone]) in 5 working days, call the office and ask for your surgeon's nurse.  If you have other questions about your diagnosis, plan, or surgery, call the office and ask for your surgeon's nurse.  Pt Education - Pamphlet Given - Hernia Surgery: discussed with patient and provided information. CCS Consent - Hernia Repair - Ventral/Incisional/Umbilical : discussed with patient and provided information. Pt Education - CCS Mesh education: discussed with patient and provided information.

## 2019-11-15 DIAGNOSIS — R030 Elevated blood-pressure reading, without diagnosis of hypertension: Secondary | ICD-10-CM | POA: Diagnosis not present

## 2019-11-15 DIAGNOSIS — R079 Chest pain, unspecified: Secondary | ICD-10-CM | POA: Diagnosis not present

## 2019-11-15 DIAGNOSIS — F411 Generalized anxiety disorder: Secondary | ICD-10-CM | POA: Diagnosis not present

## 2019-12-15 ENCOUNTER — Other Ambulatory Visit: Payer: Self-pay

## 2019-12-15 MED ORDER — FLUOXETINE HCL 20 MG PO TABS: 20.0000 mg | ORAL_TABLET | Freq: Every day | ORAL | 1 refills | Status: DC

## 2020-01-22 ENCOUNTER — Other Ambulatory Visit: Payer: Self-pay | Admitting: *Deleted

## 2020-01-23 MED ORDER — FLUOXETINE HCL 20 MG PO TABS: 20.0000 mg | ORAL_TABLET | Freq: Every day | ORAL | 0 refills | Status: DC

## 2020-01-23 NOTE — Telephone Encounter (Signed)
Please ask patient to schedule a follow up visit with Dr. Erin Hearing in the office Thanks Leeanne Rio, MD

## 2020-01-24 NOTE — Telephone Encounter (Signed)
LVM for patient to call office and schedule appointment.  Tanya Webb, [place]

## 2020-02-19 ENCOUNTER — Other Ambulatory Visit: Payer: Self-pay | Admitting: Family Medicine

## 2020-03-05 ENCOUNTER — Ambulatory Visit: Payer: 59 | Admitting: Family Medicine

## 2020-03-20 ENCOUNTER — Encounter: Payer: Self-pay | Admitting: Family Medicine

## 2020-03-20 ENCOUNTER — Ambulatory Visit (INDEPENDENT_AMBULATORY_CARE_PROVIDER_SITE_OTHER): Payer: 59 | Admitting: Family Medicine

## 2020-03-20 ENCOUNTER — Other Ambulatory Visit: Payer: Self-pay

## 2020-03-20 VITALS — BP 122/80 | HR 122 | Wt 176.8 lb

## 2020-03-20 DIAGNOSIS — R635 Abnormal weight gain: Secondary | ICD-10-CM

## 2020-03-20 DIAGNOSIS — Z13220 Encounter for screening for lipoid disorders: Secondary | ICD-10-CM

## 2020-03-20 DIAGNOSIS — F411 Generalized anxiety disorder: Secondary | ICD-10-CM

## 2020-03-20 DIAGNOSIS — Z131 Encounter for screening for diabetes mellitus: Secondary | ICD-10-CM

## 2020-03-20 DIAGNOSIS — R911 Solitary pulmonary nodule: Secondary | ICD-10-CM

## 2020-03-20 DIAGNOSIS — G5603 Carpal tunnel syndrome, bilateral upper limbs: Secondary | ICD-10-CM

## 2020-03-20 DIAGNOSIS — G5600 Carpal tunnel syndrome, unspecified upper limb: Secondary | ICD-10-CM | POA: Insufficient documentation

## 2020-03-20 DIAGNOSIS — G5602 Carpal tunnel syndrome, left upper limb: Secondary | ICD-10-CM | POA: Insufficient documentation

## 2020-03-20 DIAGNOSIS — R000 Tachycardia, unspecified: Secondary | ICD-10-CM

## 2020-03-20 LAB — POCT GLYCOSYLATED HEMOGLOBIN (HGB A1C): Hemoglobin A1C: 5.1 % (ref 4.0–5.6)

## 2020-03-20 NOTE — Assessment & Plan Note (Signed)
Cxr ordered in 2108 showed IMPRESSION: Small nodular density noted over the right lung base most likely represents nipple shadow. PA and lateral chest x-ray with nipple  markers can be obtained for confirmation. No other focal abnormality identified .  Given has been 3 years without any symptoms and patient is never smoker no further follow up is warranted

## 2020-03-20 NOTE — Assessment & Plan Note (Signed)
Perhaps mild bilateral CT.  No signs of weakness or wasting or any diffuse peripheral neuropathy.  Try night splints and monitor

## 2020-03-20 NOTE — Patient Instructions (Signed)
Nice to meet you  Try night splints for your CT.  If not better or worsening let me know  I will call you if your tests are not good.  Otherwise, I will send you a message on MyChart (if it is active) or a letter in the mail..  If you do not hear from me with in 2 weeks please call our office.     Ask your Gyn to send me a Pap report  Keep doing what you are doing

## 2020-03-20 NOTE — Assessment & Plan Note (Signed)
Stable on current dose of prozac.  Encourage regular exercise

## 2020-03-20 NOTE — Assessment & Plan Note (Signed)
Asymptomatic.  Possibly related to anxiety.  Will check TSH

## 2020-03-20 NOTE — Assessment & Plan Note (Signed)
>>  ASSESSMENT AND PLAN FOR CARPAL TUNNEL SYNDROME, LEFT WRITTEN ON 03/20/2020  1:08 PM BY CHAMBLISS, MARSHALL L, MD  Perhaps mild bilateral CT.  No signs of weakness or wasting or any diffuse peripheral neuropathy.  Try night splints and monitor

## 2020-03-20 NOTE — Progress Notes (Signed)
    SUBJECTIVE:   CHIEF COMPLAINT / HPI:   Here for wellness visit.  Overall feel well  ANXIETY - continues prozac.  Feels is helping.  Not preventing any activities  CHEST PAIN - seen in UC recently with negative work up felt to be due to anxiety.  No chest pain now and none with exertion  HAND NUMBNESS - worse in the AM.  Feels is likely carpal tunnel.  No weakness or droppng objects or loss of sensation.  No feet numbness or symptoms   GYN care - see gynecologist  [place] - Father died heart condition that started in his 16s.  History of diabetes in parent and sibling   WEIGHT GAIN - 15 lb since 2018.    PERTINENT  PMH / PSH: Never smoker  OBJECTIVE:   BP 122/80   Pulse (!) 122   Wt 176 lb 12.8 oz (80.2 kg)   SpO2 98%   BMI 27.69 kg/m   Healthy appearing Ears:  External ear exam shows no significant lesions or deformities.  Otoscopic examination reveals clear canals, tympanic membranes are intact bilaterally without bulging, retraction, inflammation or discharge. Hearing is grossly normal bilaterall Mouth - no lesions, mucous membranes are moist, no decaying teeth   Neck:  No deformities, thyromegaly, masses, or tenderness noted.   Supple with full range of motion without pain. Lungs:  Normal respiratory effort, chest expands symmetrically. Lungs are clear to auscultation, no crackles or wheezes. Heart - Regular rate and rhythm.  No murmurs, gallops or rubs.    Abdomen: soft and non-tender without masses, organomegaly or hernias noted.  No guarding or rebound Extremities:  No cyanosis, edema, or deformity noted with good range of motion of all major joints.   Skin:  Intact without suspicious lesions or rashes Negative Tinels Phalens.  No muscle weakness in hands   ASSESSMENT/PLAN:   Lung nodule Cxr ordered in 2108 showed IMPRESSION: Small nodular density noted over the right lung base most likely represents nipple shadow. PA and lateral chest x-ray with nipple  markers can  be obtained for confirmation. No other focal abnormality identified .  Given has been 3 years without any symptoms and patient is never smoker no further follow up is warranted   Anxiety state Stable on current dose of prozac.  Encourage regular exercise   TACHYCARDIA Asymptomatic.  Possibly related to anxiety.  Will check TSH      Lind Covert, MD [place]

## 2020-03-21 ENCOUNTER — Encounter: Payer: Self-pay | Admitting: Family Medicine

## 2020-03-21 LAB — LIPID PANEL
Chol/HDL Ratio: 3.1 ratio (ref 0.0–4.4)
Cholesterol, Total: 166 mg/dL (ref 100–199)
HDL: 54 mg/dL (ref >39)
LDL Chol Calc (NIH): 78 mg/dL (ref 0–99)
Triglycerides: 204 mg/dL — ABNORMAL HIGH (ref 0–149)
VLDL Cholesterol Cal: 34 mg/dL (ref 5–40)

## 2020-03-21 LAB — TSH: TSH: 1.4 u[IU]/mL (ref 0.450–4.500)

## 2020-04-16 ENCOUNTER — Other Ambulatory Visit: Payer: Self-pay | Admitting: Family Medicine

## 2020-07-17 ENCOUNTER — Encounter: Payer: Self-pay | Admitting: Neurology

## 2020-07-17 ENCOUNTER — Ambulatory Visit: Payer: 59 | Admitting: Family Medicine

## 2020-07-17 ENCOUNTER — Other Ambulatory Visit: Payer: Self-pay

## 2020-07-17 ENCOUNTER — Encounter: Payer: Self-pay | Admitting: Family Medicine

## 2020-07-17 DIAGNOSIS — G5603 Carpal tunnel syndrome, bilateral upper limbs: Secondary | ICD-10-CM

## 2020-07-17 DIAGNOSIS — R000 Tachycardia, unspecified: Secondary | ICD-10-CM

## 2020-07-17 NOTE — Assessment & Plan Note (Signed)
>>  ASSESSMENT AND PLAN FOR CARPAL TUNNEL SYNDROME, LEFT WRITTEN ON 07/17/2020  2:02 PM BY CHAMBLISS, MARSHALL L, MD  Not responding to splinting.  Exam and history not entirely consistent with CT.  Will get nerve conduction studies

## 2020-07-17 NOTE — Assessment & Plan Note (Signed)
Continues aymptomatic.  Asked to check at home at rest and to Mychart readings

## 2020-07-17 NOTE — Patient Instructions (Addendum)
Good to see you today!  Thanks for coming in.  Check your heart rate when at rest and write down and send to me  If you have not heard about scheduling the NCS in 2 weeks then let me know

## 2020-07-17 NOTE — Progress Notes (Signed)
    SUBJECTIVE:   CHIEF COMPLAINT / HPI:   CARPAL TUNNEL  Continues to have bilateral hand numbness worse with typing and driving.  Does not wake up at night.  No loss of sensation but does feel hands maybe weaker than usual.  No foot symptoms  TACHYCARDIA Her phone app shows HR 45-170.  She has no chest pain or shortness of breath or weight loss.  Recent TSH normal   ANXIETY - stable on prozac  PERTINENT  PMH / PSH: no history of neuropahy  OBJECTIVE:   BP 112/80   Pulse (!) 117   Wt 172 lb (78 kg)   SpO2 99%   BMI 26.94 kg/m   Hands - no wasting. Normal strength appearance Negative Tinels and Phalens   ASSESSMENT/PLAN:   TACHYCARDIA Continues aymptomatic.  Asked to check at home at rest and to Mychart readings   Carpal tunnel syndrome Not responding to splinting.  Exam and history not entirely consistent with CT.  Will get nerve conduction studies      Lind Covert, MD [place]

## 2020-07-17 NOTE — Assessment & Plan Note (Signed)
Not responding to splinting.  Exam and history not entirely consistent with CT.  Will get nerve conduction studies

## 2020-07-18 ENCOUNTER — Other Ambulatory Visit: Payer: Self-pay

## 2020-07-18 DIAGNOSIS — R202 Paresthesia of skin: Secondary | ICD-10-CM

## 2020-07-26 ENCOUNTER — Ambulatory Visit: Payer: Self-pay | Admitting: Surgery

## 2020-07-26 NOTE — H&P (Signed)
[name] Appointment: 07/26/2020 11:30 AM Location: [facility] Patient #: [id number] DOB: [birth year] Married / Language: Cleophus Molt / Race: White Female  History of Present Illness Marcello Moores A. Gisela Lea MD; 07/26/2020 1:03 PM) Patient words: Patient returns for follow-up of her ventral hernia. She was scheduled for surgery last year but had cancel. She comes in today to set up her surgery date. Hernia at her umbilicus and just above this is stable. No nausea, vomiting or increasing pain or size.  The patient is a 45 year old female.   Allergies Janeann Forehand, CNA; 07/26/2020 11:30 AM) [name] *NUTRIENTS* Allergies Reconciled  Medication History Janeann Forehand, CNA; 07/26/2020 11:30 AM) Multivitamin Adult (Oral) Active. FLUoxetine HCl (20MG  Tablet, Oral) Active. Allegra (Oral) Specific strength unknown - Active. Medications Reconciled     Physical Exam (Bennie Scaff A. Brenlynn Fake MD; 07/26/2020 1:03 PM)  General Mental Status-Alert. General Appearance-Consistent with stated age. Hydration-Well hydrated. Voice-Normal.  Abdomen Note: Reducible 4 cm periumbilical hernia with mild diastases. Soft nontender.  Neurologic Neurologic evaluation reveals -alert and oriented x 3 with no impairment of recent or remote memory. Mental Status-Normal.    Assessment & Plan (Laquitha Heslin A. Leo Fray MD; 07/26/2020 1:04 PM)  VENTRAL INCISIONAL HERNIA WITHOUT OBSTRUCTION OR GANGRENE (K43.2) Impression: Discussed laparoscopic and open approaches with mesh. She has opted for a laparoscopic repair of ventral hernia with mesh. The risk of hernia repair include bleeding, infection, organ injury, bowel injury, bladder injury, nerve injury recurrent hernia, blood clots, worsening of underlying condition, chronic pain, mesh use, open surgery, death, and the need for other operations. Pt agrees to proceed  total time 30 minutes  Current Plans You are being scheduled for  surgery- Our schedulers will call you.  You should hear from our office's scheduling department within 5 working days about the location, date, and time of surgery. We try to make accommodations for patient's preferences in scheduling surgery, but sometimes the OR schedule or the surgeon's schedule prevents [place] from making those accommodations.  If you have not heard from our office [phone]) in 5 working days, call the office and ask for your surgeon's nurse.  If you have other questions about your diagnosis, plan, or surgery, call the office and ask for your surgeon's nurse.  CCS Consent - Hernia Repair - Ventral/Incisional/Umbilical (Gross): discussed with patient and provided information. Pt Education - CCS Mesh education: discussed with patient and provided information

## 2020-08-26 ENCOUNTER — Encounter: Payer: Self-pay | Admitting: Family Medicine

## 2020-09-03 ENCOUNTER — Other Ambulatory Visit: Payer: Self-pay

## 2020-09-03 ENCOUNTER — Ambulatory Visit: Payer: 59 | Admitting: Neurology

## 2020-09-03 DIAGNOSIS — R202 Paresthesia of skin: Secondary | ICD-10-CM

## 2020-09-03 DIAGNOSIS — G5603 Carpal tunnel syndrome, bilateral upper limbs: Secondary | ICD-10-CM

## 2020-09-03 NOTE — Procedures (Signed)
[facility]  [address]  [place], [place] [address] Tel: [phone] Fax:  [phone] Test Date:  09/03/2020  Patient: Tanya Webb DOB: June 20, 1975 Physician: Narda Amber, DO  Sex: Female Height: 5\' 7"  Ref Phys: Talbert Cage, MD  ID#: [id number]   Technician:    Patient Complaints: This is a 45 year old female referred for evaluation of bilateral hand paresthesias, worse on the right.  NCV & EMG Findings: Extensive electrodiagnostic testing of the right upper extremity and additional studies of the left shows:  1. Bilateral median sensory responses show prolonged latency (R6.9, L4.4 ms) and reduced amplitude (R7.5, L16.0 V).  Bilateral ulnar sensory responses are within normal limits. 2. Bilateral median motor responses show prolonged latency (R4.6, L4.5 ms).  Of note, there is evidence of bilateral Martin-Gruber anastomoses, a normal anatomic variant.  Bilateral ulnar motor responses are within normal limits.   3. There is no evidence of active or chronic motor axonal loss changes affecting any of the tested muscles.  Motor unit configuration and recruitment pattern is within normal limits.  Impression: Bilateral median neuropathy at or distal to the wrist, consistent with a clinical diagnosis of carpal tunnel syndrome.  Overall, these findings are severe in degree electrically and worse on the right.   ___________________________ Narda Amber, DO    Nerve Conduction Studies Anti Sensory Summary Table   Stim Site NR Peak (ms) Norm Peak (ms) P-T Amp (V) Norm P-T Amp  Left Median Anti Sensory (2nd Digit)  35C  Wrist    4.4 <3.4 16.0 >20  Right Median Anti Sensory (2nd Digit)  35C  Wrist    6.9 <3.4 7.5 >20  Left Ulnar Anti Sensory (5th Digit)  35C  Wrist    2.6 <3.1 39.9 >12  Right Ulnar Anti Sensory (5th Digit)  35C  Wrist    2.3 <3.1 37.7 >12   Motor Summary Table   Stim Site NR Onset (ms) Norm Onset (ms) O-P Amp (mV) Norm O-P Amp  Site1 Site2 Delta-0 (ms) Dist (cm) Vel (m/s) Norm Vel (m/s)  Left Median Motor (Abd Poll Brev)  35C  Wrist    4.5 <3.9 6.1 >6 Elbow Wrist 3.8 29.0 76 >50  Elbow    8.3  8.2  Ulnar-wrist crossover Elbow 4.2 0.0    Ulnar-wristcrossover    4.1  5.0         Right Median Motor (Abd Poll Brev)  35C  Wrist    4.6 <3.9 6.2 >6 Elbow Wrist 4.5 28.0 62 >50  Elbow    9.1  4.6  Ulnar-wrist crossver Elbow 5.7 0.0    Ulnar-wrist crossver    3.4  3.8         Left Ulnar Motor (Abd Dig Minimi)  35C  Wrist    2.4 <3.1 9.4 >7 B Elbow Wrist 3.5 21.0 60 >50  B Elbow    5.9  9.2  A Elbow B Elbow 1.9 10.0 53 >50  A Elbow    7.8  9.2         Right Ulnar Motor (Abd Dig Minimi)  35C  Wrist    1.7 <3.1 10.3 >7 B Elbow Wrist 4.0 23.0 58 >50  B Elbow    5.7  10.1  A Elbow B Elbow 1.7 10.0 59 >50  A Elbow    7.4  9.7          EMG   Side Muscle Ins Act Fibs Psw Fasc Number Recrt Dur  Dur. Amp Amp. Poly Poly. Comment  Right 1stDorInt Nml Nml Nml Nml Nml Nml Nml Nml Nml Nml Nml Nml N/A  Right Abd Poll Brev Nml Nml Nml Nml Nml Nml Nml Nml Nml Nml Nml Nml N/A  Right PronatorTeres Nml Nml Nml Nml Nml Nml Nml Nml Nml Nml Nml Nml N/A  Right Biceps Nml Nml Nml Nml Nml Nml Nml Nml Nml Nml Nml Nml N/A  Right Triceps Nml Nml Nml Nml Nml Nml Nml Nml Nml Nml Nml Nml N/A  Right Deltoid Nml Nml Nml Nml Nml Nml Nml Nml Nml Nml Nml Nml N/A  Left 1stDorInt Nml Nml Nml Nml Nml Nml Nml Nml Nml Nml Nml Nml N/A  Left Abd Poll Brev Nml Nml Nml Nml Nml Nml Nml Nml Nml Nml Nml Nml N/A  Left PronatorTeres Nml Nml Nml Nml Nml Nml Nml Nml Nml Nml Nml Nml N/A      Waveforms:

## 2020-09-04 ENCOUNTER — Encounter: Payer: Self-pay | Admitting: Family Medicine

## 2020-09-04 DIAGNOSIS — G5603 Carpal tunnel syndrome, bilateral upper limbs: Secondary | ICD-10-CM

## 2020-09-06 ENCOUNTER — Other Ambulatory Visit (HOSPITAL_COMMUNITY)
Admission: RE | Admit: 2020-09-06 | Discharge: 2020-09-06 | Disposition: A | Discharge status: Home / Self Care | Payer: 59 | Source: Ambulatory Visit | Attending: Surgery | Admitting: Surgery

## 2020-09-06 DIAGNOSIS — Z20822 Contact with and (suspected) exposure to covid-19: Secondary | ICD-10-CM | POA: Diagnosis not present

## 2020-09-06 DIAGNOSIS — Z01812 Encounter for preprocedural laboratory examination: Secondary | ICD-10-CM | POA: Diagnosis present

## 2020-09-06 LAB — SARS CORONAVIRUS 2 (TAT 6-24 HRS): SARS Coronavirus 2: NEGATIVE

## 2020-09-07 ENCOUNTER — Encounter (HOSPITAL_COMMUNITY): Payer: Self-pay | Admitting: Surgery

## 2020-09-07 NOTE — Progress Notes (Signed)
Pre-op instructions given to pt. Pt. Had not stopped phentermine,instructed to stop taking it prior to surgery. No solid food after midnight, may have clear liquids til 0430 the morning of surgery. Denies chest pain/SOB. Pt. To continue to quarantine.

## 2020-09-11 NOTE — Assessment & Plan Note (Signed)
>>  ASSESSMENT AND PLAN FOR CARPAL TUNNEL SYNDROME, LEFT WRITTEN ON 09/11/2020  3:06 PM BY CHAMBLISS, MARSHALL L, MD  She would like referral to [facility] for injections

## 2020-09-11 NOTE — Assessment & Plan Note (Signed)
She would like referral to [facility] for injections

## 2020-09-17 ENCOUNTER — Ambulatory Visit: Payer: 59 | Admitting: Sports Medicine

## 2020-09-17 ENCOUNTER — Ambulatory Visit: Payer: 59 | Admitting: Family Medicine

## 2020-09-17 ENCOUNTER — Ambulatory Visit: Payer: Self-pay

## 2020-09-17 ENCOUNTER — Other Ambulatory Visit: Payer: Self-pay

## 2020-09-17 ENCOUNTER — Encounter: Payer: Self-pay | Admitting: Family Medicine

## 2020-09-17 VITALS — BP 130/98 | Ht 67.0 in | Wt 165.0 lb

## 2020-09-17 DIAGNOSIS — G5603 Carpal tunnel syndrome, bilateral upper limbs: Secondary | ICD-10-CM

## 2020-09-17 MED ORDER — METHYLPREDNISOLONE ACETATE 40 MG/ML IJ SUSP
40.0000 mg | Freq: Once | INTRAMUSCULAR | Status: AC
  Administered 2020-09-17: 40 mg via INTRA_ARTICULAR

## 2020-09-17 NOTE — Progress Notes (Signed)
Tanya Webb - 45 y.o. female MRN [id number]  Date of birth: Feb 23, 1976  SUBJECTIVE:  Including CC & ROS.  No chief complaint on file.   Tanya Webb is a 45 y.o. female that is presenting with bilateral carpal tunnel.  Symptoms seem to be getting worse with right being worse than left.  She has exacerbation of symptoms with everyday activities as well as work on the computer. .  Review of the EMG is demonstrating bilateral carpal tunnel disease with right worse than left   Review of Systems See HPI   HISTORY: Past Medical, Surgical, Social, and Family History Reviewed & Updated per EMR.   Pertinent Historical Findings include:  Past Medical History:  Diagnosis Date  . Abdominal pain   . Anxiety   . History of kidney stones     Past Surgical History:  Procedure Laterality Date  . CHOLECYSTECTOMY N/A 04/01/2016   Procedure: LAPAROSCOPIC CHOLECYSTECTOMY WITH INTRAOPERATIVE CHOLANGIOGRAM;  Surgeon: Erroll Luna, MD;  Location: [place];  Service: General;  Laterality: N/A;  . CHOLECYSTECTOMY  05/2016  . CYSTOSCOPY W/ URETEROSCOPY W/ LITHOTRIPSY    . DILATION AND CURETTAGE OF UTERUS    . UMBILICAL HERNIA REPAIR N/A 04/01/2016   Procedure: HERNIA REPAIR UMBILICAL ADULT;  Surgeon: Erroll Luna, MD;  Location: [facility] OR;  Service: General;  Laterality: N/A;    Family History  Problem Relation Age of Onset  . Diabetes Father   . Heart disease Father   . Diabetes Brother     Social History   Socioeconomic History  . Marital status: Married    Spouse name: Not on file  . Number of children: Not on file  . Years of education: Not on file  . Highest education level: Not on file  Occupational History  . Not on file  Tobacco Use  . Smoking status: Never Smoker  . Smokeless tobacco: Never Used  Substance and Sexual Activity  . Alcohol use: Yes    Comment: weekends- sometimes  . Drug use: No  . Sexual activity: Yes    Birth control/protection: None, Surgical  Other  Topics Concern  . Not on file  Social History Narrative  . Not on file   Social Determinants of Health   Financial Resource Strain: Not on file  Food Insecurity: Not on file  Transportation Needs: Not on file  Physical Activity: Not on file  Stress: Not on file  Social Connections: Not on file  Intimate Partner Violence: Not on file     PHYSICAL EXAM:  VS: BP (!) 130/98 (BP Location: Right Arm, Patient Position: Sitting, Cuff Size: Normal)   Ht 5\' 7"  (1.702 m)   Wt 165 lb (74.8 kg)   BMI 25.84 kg/m  Physical Exam Gen: NAD, alert, cooperative with exam, well-appearing MSK:  Right and left hand: No signs of atrophy. Normal strength resistance. Neurovascular intact  Limited ultrasound: Left wrist:  Median nerve and carpal tunnel is flattened.  Is mobile on dynamic testing  Summary: Findings demonstrate carpal tunnel syndrome  Ultrasound and interpretation by Clearance Coots, MD   Aspiration/Injection Procedure Note Tanya Webb March 20, 1976  Procedure: Injection Indications: Left carpal tunnel syndrome  Procedure Details Consent: Risks of procedure as well as the alternatives and risks of each were explained to the (patient/caregiver).  Consent for procedure obtained. Time Out: Verified patient identification, verified procedure, site/side was marked, verified correct patient position, special equipment/implants available, medications/allergies/relevent history reviewed, required imaging and test results available.  Performed.  The area was cleaned with iodine and alcohol swabs.    The left carpal tunnel was injected using 1 cc's of 40 mg Depo-Medrol and 1 cc's of 0.25% bupivacaine with a 25 1 1/2" needle.  Ultrasound was used. Images were obtained in long views showing the injection.     A sterile dressing was applied.  Patient did tolerate procedure well.    ASSESSMENT & PLAN:   Carpal tunnel syndrome Acute on chronic in nature.  EMG was demonstrating  changes of each wrist. -Counseled on home exercise therapy and supportive care. - injection  - could consider Sonex  - can perform right side injection

## 2020-09-17 NOTE — Assessment & Plan Note (Signed)
Acute on chronic in nature.  EMG was demonstrating changes of each wrist. -Counseled on home exercise therapy and supportive care. - injection  - could consider Sonex  - can perform right side injection

## 2020-09-17 NOTE — Patient Instructions (Signed)
Nice to meet you Please try the exercises   Please send me a message in MyChart with any questions or updates.  Please see me back in 4 weeks.   --Dr. Raeford Razor

## 2020-09-17 NOTE — Assessment & Plan Note (Signed)
>>  ASSESSMENT AND PLAN FOR CARPAL TUNNEL SYNDROME, LEFT WRITTEN ON 09/17/2020  1:47 PM BY SCHMITZ, JEREMY E, MD  Acute on chronic in nature.  EMG was demonstrating changes of each wrist. -Counseled on home exercise therapy and supportive care. - injection  - could consider Sonex  - can perform right side injection

## 2020-09-24 ENCOUNTER — Ambulatory Visit: Payer: 59 | Admitting: Family Medicine

## 2020-09-24 ENCOUNTER — Other Ambulatory Visit: Payer: Self-pay

## 2020-09-24 ENCOUNTER — Encounter: Payer: Self-pay | Admitting: Family Medicine

## 2020-09-24 ENCOUNTER — Ambulatory Visit: Payer: Self-pay

## 2020-09-24 VITALS — BP 132/76 | Ht 67.0 in | Wt 165.0 lb

## 2020-09-24 DIAGNOSIS — G5603 Carpal tunnel syndrome, bilateral upper limbs: Secondary | ICD-10-CM | POA: Diagnosis not present

## 2020-09-24 MED ORDER — METHYLPREDNISOLONE ACETATE 40 MG/ML IJ SUSP
40.0000 mg | Freq: Once | INTRAMUSCULAR | Status: AC
Start: 2020-09-24 — End: 2020-09-24
  Administered 2020-09-24: 40 mg via INTRA_ARTICULAR

## 2020-09-24 NOTE — Assessment & Plan Note (Signed)
>>  ASSESSMENT AND PLAN FOR CARPAL TUNNEL SYNDROME, LEFT WRITTEN ON 09/24/2020 12:30 PM BY SCHMITZ, JEREMY E, MD  Had good improvement with the injection of left carpal tunnel. -Counseled on supportive care. -Right carpal tunnel injection today. -Could consider physical therapy.

## 2020-09-24 NOTE — Progress Notes (Signed)
[name] - 45 y.o. female MRN [id number]  Date of birth: 02/13/1976  SUBJECTIVE:  Including CC & ROS.  No chief complaint on file.   Tanya Webb is a 45 y.o. female that is presenting for her right carpal tunnel injection.  Review of Systems See HPI   HISTORY: Past Medical, Surgical, Social, and Family History Reviewed & Updated per EMR.   Pertinent Historical Findings include:  Past Medical History:  Diagnosis Date  . Abdominal pain   . Anxiety   . History of kidney stones     Past Surgical History:  Procedure Laterality Date  . CHOLECYSTECTOMY N/A 04/01/2016   Procedure: LAPAROSCOPIC CHOLECYSTECTOMY WITH INTRAOPERATIVE CHOLANGIOGRAM;  Surgeon: Erroll Luna, MD;  Location: [place];  Service: General;  Laterality: N/A;  . CHOLECYSTECTOMY  05/2016  . CYSTOSCOPY W/ URETEROSCOPY W/ LITHOTRIPSY    . DILATION AND CURETTAGE OF UTERUS    . UMBILICAL HERNIA REPAIR N/A 04/01/2016   Procedure: HERNIA REPAIR UMBILICAL ADULT;  Surgeon: Erroll Luna, MD;  Location: [facility] OR;  Service: General;  Laterality: N/A;    Family History  Problem Relation Age of Onset  . Diabetes Father   . Heart disease Father   . Diabetes Brother     Social History   Socioeconomic History  . Marital status: Married    Spouse name: Not on file  . Number of children: Not on file  . Years of education: Not on file  . Highest education level: Not on file  Occupational History  . Not on file  Tobacco Use  . Smoking status: Never Smoker  . Smokeless tobacco: Never Used  Substance and Sexual Activity  . Alcohol use: Yes    Comment: weekends- sometimes  . Drug use: No  . Sexual activity: Yes    Birth control/protection: None, Surgical  Other Topics Concern  . Not on file  Social History Narrative  . Not on file   Social Determinants of Health   Financial Resource Strain: Not on file  Food Insecurity: Not on file  Transportation Needs: Not on file  Physical Activity: Not on file   Stress: Not on file  Social Connections: Not on file  Intimate Partner Violence: Not on file     PHYSICAL EXAM:  VS: BP 132/76 (BP Location: Left Arm, Patient Position: Sitting, Cuff Size: Normal)   Ht 5\' 7"  (1.702 m)   Wt 165 lb (74.8 kg)   BMI 25.84 kg/m  Physical Exam Gen: NAD, alert, cooperative with exam, well-appearing  Limited ultrasound: right wrist:  Flattening of the median nerve in the carpal tunnel.  Summary: Findings associated with carpal tunnel syndrome.  Ultrasound and interpretation by Clearance Coots, MD    Aspiration/Injection Procedure Note [name] 08/26/1975  Procedure: Injection Indications: Right carpal tunnel  Procedure Details Consent: Risks of procedure as well as the alternatives and risks of each were explained to the (patient/caregiver).  Consent for procedure obtained. Time Out: Verified patient identification, verified procedure, site/side was marked, verified correct patient position, special equipment/implants available, medications/allergies/relevent history reviewed, required imaging and test results available.  Performed.  The area was cleaned with iodine and alcohol swabs.    The right median nerve in the carpal tunnel was injected using 1 cc's of 40 mg Depo-Medrol and 1 cc's of 0.25% bupivacaine with a 25 1 1/2" needle.  Ultrasound was used. Images were obtained in long views showing the injection.     A sterile dressing was applied.  Patient did tolerate procedure well.      ASSESSMENT & PLAN:   Carpal tunnel syndrome Had good improvement with the injection of left carpal tunnel. -Counseled on supportive care. -Right carpal tunnel injection today. -Could consider physical therapy.

## 2020-09-24 NOTE — Patient Instructions (Signed)
Good to see you Please try the exercises   Please send me a message in MyChart with any questions or updates.  Please see me back in 4-6 weeks.   --Dr. Raeford Razor

## 2020-09-24 NOTE — Assessment & Plan Note (Signed)
Had good improvement with the injection of left carpal tunnel. -Counseled on supportive care. -Right carpal tunnel injection today. -Could consider physical therapy.

## 2020-09-26 ENCOUNTER — Encounter (HOSPITAL_COMMUNITY): Payer: Self-pay | Admitting: Certified Registered Nurse Anesthetist

## 2020-09-27 ENCOUNTER — Ambulatory Visit (HOSPITAL_COMMUNITY): Admission: RE | Admit: 2020-09-27 | Payer: 59 | Source: Ambulatory Visit | Admitting: Surgery

## 2020-09-27 HISTORY — DX: Anxiety disorder, unspecified: F41.9

## 2020-09-27 SURGERY — REPAIR, HERNIA, VENTRAL, LAPAROSCOPIC
Anesthesia: General

## 2020-09-27 NOTE — Progress Notes (Addendum)
Multiple attempts to reach patient for procedure instructions.  Patient has not returned phone call and has not shown up for scheduled arrival time.  Dr. Brantley Stage has been paged, awaiting return call.  Rodena Piety at [employer] and charge CRNA notified.    Update: Dr. Brantley Stage aware and stated patient should not have been scheduled for a procedure today.

## 2020-10-24 ENCOUNTER — Other Ambulatory Visit: Payer: Self-pay | Admitting: Family Medicine

## 2020-10-29 ENCOUNTER — Encounter: Payer: Self-pay | Admitting: Family Medicine

## 2020-10-29 ENCOUNTER — Ambulatory Visit: Payer: 59 | Admitting: Family Medicine

## 2020-10-29 ENCOUNTER — Other Ambulatory Visit: Payer: Self-pay

## 2020-10-29 DIAGNOSIS — G5603 Carpal tunnel syndrome, bilateral upper limbs: Secondary | ICD-10-CM | POA: Diagnosis not present

## 2020-10-29 NOTE — Assessment & Plan Note (Signed)
>>  ASSESSMENT AND PLAN FOR CARPAL TUNNEL SYNDROME, LEFT WRITTEN ON 10/29/2020 12:59 PM BY SCHMITZ, JEREMY E, MD  Has gotten improvement with the injections. Symptoms started to return.  -Counseled on home exercise therapy and supportive care. -Provided Pennsaid samples. -Could consider physical therapy or repeat injection.

## 2020-10-29 NOTE — Progress Notes (Signed)
Medication Samples have been provided to the patient.  Drug name: Pennsaid       Strength: 2%        Qty: 2 boxes LOT: [id number]  Exp.Date: 10/2021  Dosing instructions:use a pea sized amount on affected area  The patient has been instructed regarding the correct time, dose, and frequency of taking this medication, including desired effects and most common side effects.   Tanya Webb 12:24 PM 10/29/2020

## 2020-10-29 NOTE — Assessment & Plan Note (Signed)
Has gotten improvement with the injections. Symptoms started to return.  -Counseled on home exercise therapy and supportive care. -Provided Pennsaid samples. -Could consider physical therapy or repeat injection.

## 2020-10-29 NOTE — Progress Notes (Signed)
  Tanya Webb - 45 y.o. female MRN [id number]  Date of birth: 08/27/1975  SUBJECTIVE:  Including CC & ROS.  No chief complaint on file.   Tanya Webb is a 45 y.o. female that is following up for her bilateral carpal tunnel.  She has gotten improvement from the injections with symptoms seem to return.   Review of Systems See HPI   HISTORY: Past Medical, Surgical, Social, and Family History Reviewed & Updated per EMR.   Pertinent Historical Findings include:  Past Medical History:  Diagnosis Date  . Abdominal pain   . Anxiety   . History of kidney stones     Past Surgical History:  Procedure Laterality Date  . CHOLECYSTECTOMY N/A 04/01/2016   Procedure: LAPAROSCOPIC CHOLECYSTECTOMY WITH INTRAOPERATIVE CHOLANGIOGRAM;  Surgeon: Erroll Luna, MD;  Location: [place];  Service: General;  Laterality: N/A;  . CHOLECYSTECTOMY  05/2016  . CYSTOSCOPY W/ URETEROSCOPY W/ LITHOTRIPSY    . DILATION AND CURETTAGE OF UTERUS    . UMBILICAL HERNIA REPAIR N/A 04/01/2016   Procedure: HERNIA REPAIR UMBILICAL ADULT;  Surgeon: Erroll Luna, MD;  Location: [facility] OR;  Service: General;  Laterality: N/A;    Family History  Problem Relation Age of Onset  . Diabetes Father   . Heart disease Father   . Diabetes Brother     Social History   Socioeconomic History  . Marital status: Married    Spouse name: Not on file  . Number of children: Not on file  . Years of education: Not on file  . Highest education level: Not on file  Occupational History  . Not on file  Tobacco Use  . Smoking status: Never Smoker  . Smokeless tobacco: Never Used  Substance and Sexual Activity  . Alcohol use: Yes    Comment: weekends- sometimes  . Drug use: No  . Sexual activity: Yes    Birth control/protection: None, Surgical  Other Topics Concern  . Not on file  Social History Narrative  . Not on file   Social Determinants of Health   Financial Resource Strain: Not on file  Food Insecurity: Not on file   Transportation Needs: Not on file  Physical Activity: Not on file  Stress: Not on file  Social Connections: Not on file  Intimate Partner Violence: Not on file     PHYSICAL EXAM:  VS: BP 130/90 (BP Location: Left Arm, Patient Position: Sitting, Cuff Size: Normal)   Ht 5\' 7"  (1.702 m)   Wt 165 lb (74.8 kg)   BMI 25.84 kg/m  Physical Exam Gen: NAD, alert, cooperative with exam, well-appearing    ASSESSMENT & PLAN:   Carpal tunnel syndrome Has gotten improvement with the injections. Symptoms started to return.  -Counseled on home exercise therapy and supportive care. -Provided Pennsaid samples. -Could consider physical therapy or repeat injection.

## 2020-11-18 ENCOUNTER — Other Ambulatory Visit (HOSPITAL_COMMUNITY): Payer: 59

## 2020-12-17 ENCOUNTER — Encounter: Payer: Self-pay | Admitting: Family Medicine

## 2020-12-17 ENCOUNTER — Ambulatory Visit: Payer: Self-pay

## 2020-12-17 ENCOUNTER — Ambulatory Visit: Payer: 59 | Admitting: Family Medicine

## 2020-12-17 ENCOUNTER — Other Ambulatory Visit: Payer: Self-pay

## 2020-12-17 VITALS — BP 110/70 | Ht 67.0 in | Wt 165.0 lb

## 2020-12-17 DIAGNOSIS — G5603 Carpal tunnel syndrome, bilateral upper limbs: Secondary | ICD-10-CM | POA: Diagnosis not present

## 2020-12-17 MED ORDER — METHYLPREDNISOLONE ACETATE 40 MG/ML IJ SUSP
40.0000 mg | Freq: Once | INTRAMUSCULAR | Status: AC
  Administered 2020-12-17: 40 mg via INTRA_ARTICULAR

## 2020-12-17 NOTE — Progress Notes (Signed)
Tanya Webb - 45 y.o. female MRN [id number]  Date of birth: January 23, 1976  SUBJECTIVE:  Including CC & ROS.  No chief complaint on file.   Tanya Webb is a 45 y.o. female that is presenting with acute changes of the right carpal tunnel..    Review of Systems See HPI   HISTORY: Past Medical, Surgical, Social, and Family History Reviewed & Updated per EMR.   Pertinent Historical Findings include:  Past Medical History:  Diagnosis Date   Abdominal pain    Anxiety    History of kidney stones     Past Surgical History:  Procedure Laterality Date   CHOLECYSTECTOMY N/A 04/01/2016   Procedure: LAPAROSCOPIC CHOLECYSTECTOMY WITH INTRAOPERATIVE CHOLANGIOGRAM;  Surgeon: Erroll Luna, MD;  Location: [place];  Service: General;  Laterality: N/A;   CHOLECYSTECTOMY  05/2016   CYSTOSCOPY W/ URETEROSCOPY W/ LITHOTRIPSY     DILATION AND CURETTAGE OF UTERUS     UMBILICAL HERNIA REPAIR N/A 04/01/2016   Procedure: HERNIA REPAIR UMBILICAL ADULT;  Surgeon: Erroll Luna, MD;  Location: [place];  Service: General;  Laterality: N/A;    Family History  Problem Relation Age of Onset   Diabetes Father    Heart disease Father    Diabetes Brother     Social History   Socioeconomic History   Marital status: Married    Spouse name: Not on file   Number of children: Not on file   Years of education: Not on file   Highest education level: Not on file  Occupational History   Not on file  Tobacco Use   Smoking status: Never   Smokeless tobacco: Never  Substance and Sexual Activity   Alcohol use: Yes    Comment: weekends- sometimes   Drug use: No   Sexual activity: Yes    Birth control/protection: None, Surgical  Other Topics Concern   Not on file  Social History Narrative   Not on file   Social Determinants of Health   Financial Resource Strain: Not on file  Food Insecurity: Not on file  Transportation Needs: Not on file  Physical Activity: Not on file  Stress: Not on file   Social Connections: Not on file  Intimate Partner Violence: Not on file     PHYSICAL EXAM:  VS: BP 110/70 (BP Location: Left Arm, Patient Position: Sitting, Cuff Size: Normal)   Ht 5\' 7"  (1.702 m)   Wt 165 lb (74.8 kg)   BMI 25.84 kg/m  Physical Exam Gen: NAD, alert, cooperative with exam, well-appearing MSK:  Right hand: No signs of atrophy. Positive Tinel's at the wrist. Normal grip strength. Neurovascular intact  Limited ultrasound: Right wrist:  Flattening of the median nerve in the carpal tunnel.  Summary: Findings consistent with carpal tunnel disease.  Ultrasound and interpretation by Clearance Coots, MD   Aspiration/Injection Procedure Note Tanya Webb 07-22-1975  Procedure: Injection Indications: Right carpal tunnel syndrome  Procedure Details Consent: Risks of procedure as well as the alternatives and risks of each were explained to the (patient/caregiver).  Consent for procedure obtained. Time Out: Verified patient identification, verified procedure, site/side was marked, verified correct patient position, special equipment/implants available, medications/allergies/relevent history reviewed, required imaging and test results available.  Performed.  The area was cleaned with iodine and alcohol swabs.    The right carpal tunnel was injected using 1 cc's of 40 mg Depo-Medrol and 1 cc's of 0.25% bupivacaine with a 25 1 1/2" needle.  Ultrasound was used. Images were obtained  in long views showing the injection.     A sterile dressing was applied.  Patient did tolerate procedure well.    ASSESSMENT & PLAN:   Carpal tunnel syndrome Acute on chronic of the right carpal tunnel.  No symptoms of the left today. -Counseled on home exercise therapy and supportive care. -Injection today. -Could consider physical therapy.

## 2020-12-17 NOTE — Assessment & Plan Note (Signed)
>>  ASSESSMENT AND PLAN FOR CARPAL TUNNEL SYNDROME, LEFT WRITTEN ON 12/17/2020  5:35 PM BY SCHMITZ, JEREMY E, MD  Acute on chronic of the right carpal tunnel.  No symptoms of the left today. -Counseled on home exercise therapy and supportive care. -Injection today. -Could consider physical therapy.

## 2020-12-17 NOTE — Assessment & Plan Note (Signed)
Acute on chronic of the right carpal tunnel.  No symptoms of the left today. -Counseled on home exercise therapy and supportive care. -Injection today. -Could consider physical therapy.

## 2020-12-17 NOTE — Patient Instructions (Signed)
Good to see you  Please send me a message in MyChart with any questions or updates.  Please see me back [place] back if your symptoms return.   --Dr. Raeford Razor

## 2020-12-19 ENCOUNTER — Ambulatory Visit: Payer: 59 | Admitting: Family Medicine

## 2021-03-18 ENCOUNTER — Ambulatory Visit: Payer: 59 | Admitting: Family Medicine

## 2021-03-18 ENCOUNTER — Ambulatory Visit: Payer: Self-pay

## 2021-03-18 ENCOUNTER — Encounter: Payer: Self-pay | Admitting: Family Medicine

## 2021-03-18 VITALS — BP 138/102 | Ht 67.0 in | Wt 165.0 lb

## 2021-03-18 DIAGNOSIS — G5601 Carpal tunnel syndrome, right upper limb: Secondary | ICD-10-CM

## 2021-03-18 DIAGNOSIS — G5603 Carpal tunnel syndrome, bilateral upper limbs: Secondary | ICD-10-CM

## 2021-03-18 MED ORDER — METHYLPREDNISOLONE ACETATE 40 MG/ML IJ SUSP
40.0000 mg | Freq: Once | INTRAMUSCULAR | Status: AC
  Administered 2021-03-18: 40 mg

## 2021-03-18 NOTE — Assessment & Plan Note (Signed)
>>  ASSESSMENT AND PLAN FOR CARPAL TUNNEL SYNDROME, LEFT WRITTEN ON 03/18/2021  2:35 PM BY SCHMITZ, JEREMY E, MD  Acutely occurring on the right side. -Counseled on home exercise therapy and supportive care. -Injection today. -Could consider physical therapy.

## 2021-03-18 NOTE — Patient Instructions (Signed)
Good to see you Please use the pennsaid as needed   Please send me a message in MyChart with any questions or updates.  Please see me back in 3 months or as needed.   --Dr. Raeford Razor

## 2021-03-18 NOTE — Progress Notes (Signed)
Tanya Webb - 45 y.o. female MRN [id number]  Date of birth: [birth year]  SUBJECTIVE:  Including CC & ROS.  No chief complaint on file.   Tanya Webb is a 45 y.o. female that is presenting with acute exacerbation of her right carpal tunnel syndrome..   Review of Systems See HPI   HISTORY: Past Medical, Surgical, Social, and Family History Reviewed & Updated per EMR.   Pertinent Historical Findings include:  Past Medical History:  Diagnosis Date   Abdominal pain    Anxiety    History of kidney stones     Past Surgical History:  Procedure Laterality Date   CHOLECYSTECTOMY N/A 04/01/2016   Procedure: LAPAROSCOPIC CHOLECYSTECTOMY WITH INTRAOPERATIVE CHOLANGIOGRAM;  Surgeon: Erroll Luna, MD;  Location: [place];  Service: General;  Laterality: N/A;   CHOLECYSTECTOMY  05/2016   CYSTOSCOPY W/ URETEROSCOPY W/ LITHOTRIPSY     DILATION AND CURETTAGE OF UTERUS     UMBILICAL HERNIA REPAIR N/A 04/01/2016   Procedure: HERNIA REPAIR UMBILICAL ADULT;  Surgeon: Erroll Luna, MD;  Location: [place];  Service: General;  Laterality: N/A;    Family History  Problem Relation Age of Onset   Diabetes Father    Heart disease Father    Diabetes Brother     Social History   Socioeconomic History   Marital status: Married    Spouse name: Not on file   Number of children: Not on file   Years of education: Not on file   Highest education level: Not on file  Occupational History   Not on file  Tobacco Use   Smoking status: Never   Smokeless tobacco: Never  Substance and Sexual Activity   Alcohol use: Yes    Comment: weekends- sometimes   Drug use: No   Sexual activity: Yes    Birth control/protection: None, Surgical  Other Topics Concern   Not on file  Social History Narrative   Not on file   Social Determinants of Health   Financial Resource Strain: Not on file  Food Insecurity: Not on file  Transportation Needs: Not on file  Physical Activity: Not on file  Stress: Not on  file  Social Connections: Not on file  Intimate Partner Violence: Not on file     PHYSICAL EXAM:  VS: BP (!) 138/102 (BP Location: Left Arm, Patient Position: Sitting, Cuff Size: Normal)   Ht 5\' 7"  (1.702 m)   Wt 165 lb (74.8 kg)   BMI 25.84 kg/m  Physical Exam Gen: NAD, alert, cooperative with exam, well-appearing   Limited ultrasound: Right wrist:  Flattening of the median nerve to suggest carpal tunnel  Summary: Carpal tunnel syndrome of the right wrist  Ultrasound and interpretation by Clearance Coots, MD   Aspiration/Injection Procedure Note Tanya Webb [birth year]  Procedure: Injection Indications: Right wrist pain  Procedure Details Consent: Risks of procedure as well as the alternatives and risks of each were explained to the (patient/caregiver).  Consent for procedure obtained. Time Out: Verified patient identification, verified procedure, site/side was marked, verified correct patient position, special equipment/implants available, medications/allergies/relevent history reviewed, required imaging and test results available.  Performed.  The area was cleaned with iodine and alcohol swabs.    The right carpal tunnel median nerve was injected using 1 cc's of 40 mg Depo-Medrol and 2 cc's of 0.25% bupivacaine with a 25 1 1/2" needle.  Ultrasound was used. Images were obtained in long views showing the injection.     A sterile dressing was  applied.  Patient did tolerate procedure well.     ASSESSMENT & PLAN:   Carpal tunnel syndrome Acutely occurring on the right side. -Counseled on home exercise therapy and supportive care. -Injection today. -Could consider physical therapy.

## 2021-03-18 NOTE — Assessment & Plan Note (Signed)
Acutely occurring on the right side. -Counseled on home exercise therapy and supportive care. -Injection today. -Could consider physical therapy.

## 2021-03-26 ENCOUNTER — Encounter: Payer: Self-pay | Admitting: Family Medicine

## 2021-03-26 ENCOUNTER — Ambulatory Visit: Payer: 59 | Admitting: Family Medicine

## 2021-03-26 ENCOUNTER — Ambulatory Visit: Payer: Self-pay

## 2021-03-26 ENCOUNTER — Telehealth: Payer: Self-pay | Admitting: Family Medicine

## 2021-03-26 VITALS — BP 130/98 | Ht 67.0 in | Wt 165.0 lb

## 2021-03-26 DIAGNOSIS — G5602 Carpal tunnel syndrome, left upper limb: Secondary | ICD-10-CM

## 2021-03-26 MED ORDER — METHYLPREDNISOLONE ACETATE 40 MG/ML IJ SUSP
40.0000 mg | Freq: Once | INTRAMUSCULAR | Status: AC
Start: 2021-03-26 — End: 2021-03-26
  Administered 2021-03-26: 40 mg

## 2021-03-26 NOTE — Assessment & Plan Note (Signed)
>>  ASSESSMENT AND PLAN FOR CARPAL TUNNEL SYNDROME, LEFT WRITTEN ON 03/26/2021  4:46 PM BY SCHMITZ, JEREMY E, MD  Acute on chronic in nature. Clinically consistent with carpal tunnel.  -Counseled on home exercise therapy and supportive care. -Injection today.

## 2021-03-26 NOTE — Progress Notes (Signed)
Tanya Webb - 45 y.o. female MRN [id number]  Date of birth: [birth year]  SUBJECTIVE:  Including CC & ROS.  No chief complaint on file.   Tanya Webb is a 45 y.o. female that is presenting with acute worsening of her left carpal tunnel syndrome.  No injury or inciting events.   Review of Systems See HPI   HISTORY: Past Medical, Surgical, Social, and Family History Reviewed & Updated per EMR.   Pertinent Historical Findings include:  Past Medical History:  Diagnosis Date   Abdominal pain    Anxiety    History of kidney stones     Past Surgical History:  Procedure Laterality Date   CHOLECYSTECTOMY N/A 04/01/2016   Procedure: LAPAROSCOPIC CHOLECYSTECTOMY WITH INTRAOPERATIVE CHOLANGIOGRAM;  Surgeon: Erroll Luna, MD;  Location: [place];  Service: General;  Laterality: N/A;   CHOLECYSTECTOMY  05/2016   CYSTOSCOPY W/ URETEROSCOPY W/ LITHOTRIPSY     DILATION AND CURETTAGE OF UTERUS     UMBILICAL HERNIA REPAIR N/A 04/01/2016   Procedure: HERNIA REPAIR UMBILICAL ADULT;  Surgeon: Erroll Luna, MD;  Location: [place];  Service: General;  Laterality: N/A;    Family History  Problem Relation Age of Onset   Diabetes Father    Heart disease Father    Diabetes Brother     Social History   Socioeconomic History   Marital status: Married    Spouse name: Not on file   Number of children: Not on file   Years of education: Not on file   Highest education level: Not on file  Occupational History   Not on file  Tobacco Use   Smoking status: Never   Smokeless tobacco: Never  Substance and Sexual Activity   Alcohol use: Yes    Comment: weekends- sometimes   Drug use: No   Sexual activity: Yes    Birth control/protection: None, Surgical  Other Topics Concern   Not on file  Social History Narrative   Not on file   Social Determinants of Health   Financial Resource Strain: Not on file  Food Insecurity: Not on file  Transportation Needs: Not on file  Physical Activity: Not  on file  Stress: Not on file  Social Connections: Not on file  Intimate Partner Violence: Not on file     PHYSICAL EXAM:  VS: BP (!) 130/98 (BP Location: Left Arm, Patient Position: Sitting, Cuff Size: Normal)   Ht 5\' 7"  (1.702 m)   Wt 165 lb (74.8 kg)   BMI 25.84 kg/m  Physical Exam Gen: NAD, alert, cooperative with exam, well-appearing   Limited ultrasound: Left wrist:  No significant structural changes of the median nerve within the carpal tunnel.  Summary: No significant structural changes appreciated  Ultrasound and interpretation by Clearance Coots, MD   Aspiration/Injection Procedure Note Tanya Webb 12-06-1975  Procedure: Injection Indications: Left wrist pain  Procedure Details Consent: Risks of procedure as well as the alternatives and risks of each were explained to the (patient/caregiver).  Consent for procedure obtained. Time Out: Verified patient identification, verified procedure, site/side was marked, verified correct patient position, special equipment/implants available, medications/allergies/relevent history reviewed, required imaging and test results available.  Performed.  The area was cleaned with iodine and alcohol swabs.    The left median nerve in the carpal tunnel was injected using 1 cc's of 40 mg Depo-Medrol and 2 cc's of 0.25% bupivacaine with a 25 1 1/2" needle.  Ultrasound was used. Images were obtained in long views showing the  injection.     A sterile dressing was applied.  Patient did tolerate procedure well.     ASSESSMENT & PLAN:   Carpal tunnel syndrome, left Acute on chronic in nature. Clinically consistent with carpal tunnel.  -Counseled on home exercise therapy and supportive care. -Injection today.

## 2021-03-26 NOTE — Patient Instructions (Signed)
Good to see you ?Please use ice as needed  ?Please send me a message in MyChart with any questions or updates.  ?Please see me back in 3 months.  ? ?--Dr. Chamaine Stankus ? ?

## 2021-03-26 NOTE — Assessment & Plan Note (Signed)
Acute on chronic in nature. Clinically consistent with carpal tunnel.  -Counseled on home exercise therapy and supportive care. -Injection today.

## 2021-04-08 ENCOUNTER — Other Ambulatory Visit: Payer: Self-pay | Admitting: Obstetrics

## 2021-04-08 DIAGNOSIS — D259 Leiomyoma of uterus, unspecified: Secondary | ICD-10-CM

## 2021-04-10 ENCOUNTER — Other Ambulatory Visit (HOSPITAL_COMMUNITY): Payer: Self-pay | Admitting: Obstetrics

## 2021-04-10 ENCOUNTER — Ambulatory Visit
Admission: RE | Admit: 2021-04-10 | Discharge: 2021-04-10 | Disposition: A | Discharge status: Home / Self Care | Payer: 59 | Source: Ambulatory Visit | Attending: Obstetrics | Admitting: Obstetrics

## 2021-04-10 ENCOUNTER — Other Ambulatory Visit: Payer: Self-pay | Admitting: Obstetrics

## 2021-04-10 DIAGNOSIS — D259 Leiomyoma of uterus, unspecified: Secondary | ICD-10-CM

## 2021-04-10 DIAGNOSIS — D219 Benign neoplasm of connective and other soft tissue, unspecified: Secondary | ICD-10-CM

## 2021-04-10 HISTORY — PX: IR RADIOLOGIST EVAL & MGMT: IMG5224

## 2021-04-10 NOTE — Consult Note (Signed)
Chief Complaint: Patient was seen in consultation today for symptomatic uterine fibroids at the request of Fogleman,Kelly  Referring Physician(s): Aloha Gell  History of Present Illness: Tanya Webb is a 45 y.o. [phone] female with a longstanding history of known uterine fibroids since at least 2017 and associated menorrhagia.  Menorrhagia has been treated in the past with oral contraceptives and IUD placement.  IUD was removed on 01/28/2021.  She has had irregular and more frequent bleeding since IUD removal.  Typical periods last 7 to 10 days with 3 to 4 days of heavy bleeding and passage of clots.  She has had a history of bleeding between periods.  She has associated cramping with her menstrual cycles.  Some pelvic pain, hip pain and low back pain.  No significant urinary symptoms.  No history of prior fibroid surgery or gynecologic infections. She has had low iron in the past but no significant anemia.  Pap smear on 08/20/2020 was negative.  Endometrial biopsy on 03/23/2021 was negative.  Sonohysterography on 03/17/2021 was technically difficult due to multiple uterine fibroids.  There was evidence of possible septate uterus.  Prior pelvic ultrasound on 03/03/2017 demonstrated possible uterine fibroids with the largest measuring 6 cm in diameter.  The patient has a 55 year old daughter born by vaginal delivery.  She does not have any future pregnancy plans. She is scheduled for a pelvic MRI next Tuesday.    Past Medical History:  Diagnosis Date   Abdominal pain    Anxiety    History of kidney stones     Past Surgical History:  Procedure Laterality Date   CHOLECYSTECTOMY N/A 04/01/2016   Procedure: LAPAROSCOPIC CHOLECYSTECTOMY WITH INTRAOPERATIVE CHOLANGIOGRAM;  Surgeon: Erroll Luna, MD;  Location: [place];  Service: General;  Laterality: N/A;   CHOLECYSTECTOMY  05/2016   CYSTOSCOPY W/ URETEROSCOPY W/ LITHOTRIPSY     DILATION AND CURETTAGE OF UTERUS     UMBILICAL HERNIA  REPAIR N/A 04/01/2016   Procedure: HERNIA REPAIR UMBILICAL ADULT;  Surgeon: Erroll Luna, MD;  Location: [place];  Service: General;  Laterality: N/A;    Allergies: Bee venom  Medications: Prior to Admission medications   Medication Sig Start Date End Date Taking? Authorizing Provider  FLUoxetine (PROZAC) 20 MG tablet TAKE ONE TABLET BY MOUTH DAILY 10/24/20   Lind Covert, MD  Multiple Vitamin (MULTIVITAMIN WITH MINERALS) TABS tablet Take 2 tablets by mouth daily. olly    [provider]  phentermine 37.5 MG capsule Take 37.5 mg by mouth every morning.    [provider]  valACYclovir (VALTREX) 1000 MG tablet Take 2 tablets (2,000 mg total) by mouth 2 (two) times daily. For one day at the onset of cold sore symptoms Patient taking differently: Take 2,000 mg by mouth daily as needed (For one day at the onset of cold sore symptoms). 08/22/19   Lind Covert, MD     Family History  Problem Relation Age of Onset   Diabetes Father    Heart disease Father    Diabetes Brother     Social History   Socioeconomic History   Marital status: Married    Spouse name: Not on file   Number of children: Not on file   Years of education: Not on file   Highest education level: Not on file  Occupational History   Not on file  Tobacco Use   Smoking status: Never   Smokeless tobacco: Never  Substance and Sexual Activity   Alcohol use: Yes  Comment: weekends- sometimes   Drug use: No   Sexual activity: Yes    Birth control/protection: None, Surgical  Other Topics Concern   Not on file  Social History Narrative   Not on file   Social Determinants of Health   Financial Resource Strain: Not on file  Food Insecurity: Not on file  Transportation Needs: Not on file  Physical Activity: Not on file  Stress: Not on file  Social Connections: Not on file    Review of Systems: A 12 point ROS discussed and pertinent positives are indicated in the HPI above.  All  other systems are negative.  Review of Systems  Constitutional: Negative.   Respiratory: Negative.    Cardiovascular: Negative.   Genitourinary:  Positive for menstrual problem.  Musculoskeletal: Negative.   Neurological: Negative.    Vital Signs: There were no vitals taken for this visit.  Physical Exam Constitutional:      General: She is not in acute distress.    Appearance: Normal appearance. She is not ill-appearing or toxic-appearing.  Cardiovascular:     Rate and Rhythm: Normal rate and regular rhythm.     Heart sounds: Normal heart sounds. No murmur heard.   No friction rub. No gallop.  Pulmonary:     Effort: Pulmonary effort is normal. No respiratory distress.     Breath sounds: Normal breath sounds. No stridor. No wheezing, rhonchi or rales.  Abdominal:     General: Bowel sounds are normal. There is no distension.     Palpations: Abdomen is soft.     Tenderness: There is no abdominal tenderness. There is no guarding or rebound.     Comments: Palpable uterus below umbilicus. No significant tenderness.  Musculoskeletal:        General: No swelling.  Skin:    General: Skin is warm and dry.  Neurological:     General: No focal deficit present.     Mental Status: She is alert and oriented to person, place, and time.     Imaging: [place] LIMITED JOINT SPACE STRUCTURES UP RIGHT  Result Date: 04/10/2021 Limited ultrasound: Right wrist:   Flattening of the median nerve to suggest carpal tunnel   Summary: Carpal tunnel syndrome of the right wrist   Ultrasound and interpretation by Clearance Coots, MD     Aspiration/Injection Procedure Note ALLA SLOMA May 22, 1976   Procedure: Injection Indications: Right wrist pain   Procedure Details Consent: Risks of procedure as well as the alternatives and risks of each were explained to the (patient/caregiver).  Consent for procedure obtained. Time Out: Verified patient identification, verified procedure, site/side was marked, verified  correct patient position, special equipment/implants available, medications/allergies/relevent history reviewed, required imaging and test results available.  Performed.  The area was cleaned with iodine and alcohol swabs.    The right carpal tunnel median nerve was injected using 1 cc's of 40 mg Depo-Medrol and 2 cc's of 0.25% bupivacaine with a 25 1 1/2" needle.  Ultrasound was used. Images were obtained in long views showing the injection.      A sterile dressing was applied.   Patient did tolerate procedure well.   Labs:  CBC: No results for input(s): WBC, HGB, HCT, PLT in the last 8760 hours.  COAGS: No results for input(s): INR, APTT in the last 8760 hours.  BMP: No results for input(s): NA, K, CL, CO2, GLUCOSE, BUN, CALCIUM, CREATININE, GFRNONAA, GFRAA in the last 8760 hours.  Invalid input(s): CMP  LIVER FUNCTION TESTS: No  results for input(s): BILITOT, AST, ALT, ALKPHOS, PROT, ALBUMIN in the last 8760 hours.   Assessment and Plan:  I met with Mrs. Glennon Hamilton and we discussed treatment options for symptomatic uterine fibroid disease.  Hysterectomy, uterine fibroid embolization and performing no procedures were discussed.  The advantage of hysterectomy is that it is more definitive and she will no longer have any menstrual cycles.  The advantage of uterine fibroid embolization is that it is less invasive with a shorter recovery time.  Details of fibroid embolization were discussed including technical details, risks and expected outcomes.  MRI of the pelvis will be needed prior to determining whether she is a candidate for the procedure based on fibroid morphology and enhancement pattern.  An MRI has been scheduled for next Tuesday.  She is at the point of wanting to have some procedure performed as her bleeding has become quite prolonged and irregular after IUD removal and she is currently bleeding.  I will review MRI findings with Mrs. Dominique after the MRI is performed.  Thank you for this  interesting consult.  I greatly enjoyed meeting ADELYNNE JOERGER and look forward to participating in their care.  A copy of this report was sent to the requesting provider on this date.  Electronically Signed: Azzie Roup 04/10/2021, 1:42 PM    I spent a total of 30 Minutes in face to face in clinical consultation, greater than 50% of which was counseling/coordinating care for symptomatic uterine fibroids.

## 2021-04-11 ENCOUNTER — Other Ambulatory Visit: Payer: Self-pay | Admitting: Interventional Radiology

## 2021-04-11 DIAGNOSIS — D250 Submucous leiomyoma of uterus: Secondary | ICD-10-CM

## 2021-04-15 ENCOUNTER — Ambulatory Visit (HOSPITAL_COMMUNITY): Admission: RE | Admit: 2021-04-15 | Payer: 59 | Source: Ambulatory Visit

## 2021-04-28 ENCOUNTER — Other Ambulatory Visit: Payer: Self-pay | Admitting: Family Medicine

## 2021-04-29 ENCOUNTER — Ambulatory Visit (HOSPITAL_COMMUNITY): Admission: RE | Admit: 2021-04-29 | Payer: 59 | Source: Ambulatory Visit

## 2021-04-29 ENCOUNTER — Encounter (HOSPITAL_COMMUNITY): Payer: Self-pay

## 2021-05-05 ENCOUNTER — Telehealth: Payer: Self-pay | Admitting: *Deleted

## 2021-05-05 NOTE — Telephone Encounter (Signed)
Patient called and states she decided not to have Mri Pelvis for Pre UFE. She has stopped bleeding and doesn't want to have procedure at this time . She will call us in the future if she decides to have procedure./vm

## 2021-05-08 ENCOUNTER — Other Ambulatory Visit: Payer: 59

## 2021-05-29 ENCOUNTER — Telehealth: Payer: Self-pay

## 2021-05-29 NOTE — Telephone Encounter (Signed)
Patient returns call to nurse line. Patient advised of covering providers recommendations. Patient had an UC visit today.

## 2021-05-29 NOTE — Telephone Encounter (Signed)
Patient calls nurse line reporting sore throat, fever and body aches since Monday. Patient reports she can see "white spots" at the back of her throat. 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  Patient does not want to sit at [facility] and we have no apts until next week. Patient is requesting an antibiotic for presumed strep.  Please advise.

## 2021-05-29 NOTE — Telephone Encounter (Signed)
Attempted to call patient. Received only voicemail message. I recommend salt water gargles, honey, and Tylenol. If she has continued symptoms and would like to be tested, recommend being seen at urgent care or other location.   Dorris Singh, MD  Family Medicine Teaching Service

## 2021-06-12 ENCOUNTER — Ambulatory Visit: Payer: 59 | Admitting: Family Medicine

## 2021-06-17 ENCOUNTER — Ambulatory Visit: Payer: 59 | Admitting: Family Medicine

## 2021-06-17 ENCOUNTER — Encounter: Payer: Self-pay | Admitting: Family Medicine

## 2021-06-17 ENCOUNTER — Ambulatory Visit: Payer: Self-pay

## 2021-06-17 VITALS — BP 130/86 | Ht 67.0 in | Wt 162.0 lb

## 2021-06-17 DIAGNOSIS — G5601 Carpal tunnel syndrome, right upper limb: Secondary | ICD-10-CM

## 2021-06-17 MED ORDER — METHYLPREDNISOLONE ACETATE 40 MG/ML IJ SUSP
40.0000 mg | Freq: Once | INTRAMUSCULAR | Status: AC
  Administered 2021-06-17: 40 mg via INTRA_ARTICULAR

## 2021-06-17 NOTE — Assessment & Plan Note (Signed)
>>  ASSESSMENT AND PLAN FOR CARPAL TUNNEL SYNDROME, RIGHT WRITTEN ON 06/17/2021  2:59 PM BY SCHMITZ, JEREMY E, MD  Acute on chronic in nature.  Similar to her previous exacerbation of carpal tunnel syndrome. -Counseled on home exercise therapy and supportive care. -Injection today. -Could consider referral for surgery.

## 2021-06-17 NOTE — Patient Instructions (Signed)
Good to see you Please use heat or ice as needed Please send me a message in MyChart with any questions or updates.  Please see me back in 4 weeks or as needed if better.   --Dr. Raeford Razor

## 2021-06-17 NOTE — Progress Notes (Signed)
°  Tanya Webb - 46 y.o. female MRN [id number]  Date of birth: 1976/05/10  SUBJECTIVE:  Including CC & ROS.  No chief complaint on file.   Tanya Webb is a 46 y.o. female that is presenting with acute on chronic right carpal tunnel syndrome.  The pain is similar to previous episodes.  Denies any injury.  Having altered sensation in the palm.    Review of Systems See HPI   HISTORY: Past Medical, Surgical, Social, and Family History Reviewed & Updated per EMR.   Pertinent Historical Findings include:  Past Medical History:  Diagnosis Date   Abdominal pain    Anxiety    History of kidney stones     Past Surgical History:  Procedure Laterality Date   CHOLECYSTECTOMY N/A 04/01/2016   Procedure: LAPAROSCOPIC CHOLECYSTECTOMY WITH INTRAOPERATIVE CHOLANGIOGRAM;  Surgeon: Erroll Luna, MD;  Location: [facility];  Service: General;  Laterality: N/A;   CHOLECYSTECTOMY  05/2016   CYSTOSCOPY W/ URETEROSCOPY W/ LITHOTRIPSY     DILATION AND CURETTAGE OF UTERUS     IR RADIOLOGIST EVAL & MGMT  40/98/1191   UMBILICAL HERNIA REPAIR N/A 04/01/2016   Procedure: HERNIA REPAIR UMBILICAL ADULT;  Surgeon: Erroll Luna, MD;  Location: [employer];  Service: General;  Laterality: N/A;     PHYSICAL EXAM:  VS: BP 130/86 (BP Location: Left Arm, Patient Position: Sitting)    Ht 5\' 7"  (1.702 m)    Wt 162 lb (73.5 kg)    BMI 25.37 kg/m  Physical Exam Gen: NAD, alert, cooperative with exam, well-appearing MSK:  Neurovascularly intact    Limited ultrasound: Right wrist:  The median nerve was measured and did show some improvement from previous scans.  Summary: Findings consistent with carpal tunnel syndrome.  Ultrasound and interpretation by Clearance Coots, MD   Aspiration/Injection Procedure Note Tanya Webb [birth year]  Procedure: Injection Indications: Right carpal tunnel  Procedure Details Consent: Risks of procedure as well as the alternatives and risks of each were explained to the  (patient/caregiver).  Consent for procedure obtained. Time Out: Verified patient identification, verified procedure, site/side was marked, verified correct patient position, special equipment/implants available, medications/allergies/relevent history reviewed, required imaging and test results available.  Performed.  The area was cleaned with iodine and alcohol swabs.    The right carpal tunnel was injected using 1 cc's of 40 mg Depo-Medrol and 1 cc's of 0.25% bupivacaine with a 25 1 1/2" needle.  Ultrasound was used. Images were obtained in long views showing the injection.     A sterile dressing was applied.  Patient did tolerate procedure well.     ASSESSMENT & PLAN:   Carpal tunnel syndrome, right Acute on chronic in nature.  Similar to her previous exacerbation of carpal tunnel syndrome. -Counseled on home exercise therapy and supportive care. -Injection today. -Could consider referral for surgery.

## 2021-06-17 NOTE — Assessment & Plan Note (Signed)
Acute on chronic in nature.  Similar to her previous exacerbation of carpal tunnel syndrome. -Counseled on home exercise therapy and supportive care. -Injection today. -Could consider referral for surgery.

## 2021-10-02 ENCOUNTER — Ambulatory Visit: Payer: Self-pay

## 2021-10-02 ENCOUNTER — Encounter: Payer: Self-pay | Admitting: Family Medicine

## 2021-10-02 ENCOUNTER — Ambulatory Visit: Payer: 59 | Admitting: Family Medicine

## 2021-10-02 VITALS — BP 120/78 | Ht 67.0 in | Wt 162.0 lb

## 2021-10-02 DIAGNOSIS — G5601 Carpal tunnel syndrome, right upper limb: Secondary | ICD-10-CM | POA: Diagnosis not present

## 2021-10-02 MED ORDER — METHYLPREDNISOLONE ACETATE 40 MG/ML IJ SUSP
40.0000 mg | Freq: Once | INTRAMUSCULAR | Status: AC
  Administered 2021-10-02: 40 mg via INTRA_ARTICULAR

## 2021-10-02 NOTE — Patient Instructions (Signed)
Good to see you  ?Please send me a message in MyChart with any questions or updates.  ?We'll call you when we have the other mixture for the injection.  ? ?--Dr. Raeford Razor ? ?

## 2021-10-02 NOTE — Assessment & Plan Note (Signed)
>>  ASSESSMENT AND PLAN FOR CARPAL TUNNEL SYNDROME, RIGHT WRITTEN ON 10/02/2021  2:06 PM BY SCHMITZ, JEREMY E, MD  Acute on chronic in nature.  Similar to her previous bouts of carpal tunnel syndrome. -Counseled on home exercise therapy and supportive care. -Injection today. -Could consider referral for release.

## 2021-10-02 NOTE — Assessment & Plan Note (Signed)
Acute on chronic in nature.  Similar to her previous bouts of carpal tunnel syndrome. ?-Counseled on home exercise therapy and supportive care. ?-Injection today. ?-Could consider referral for release. ?

## 2021-10-02 NOTE — Progress Notes (Signed)
?  Tanya Webb - 46 y.o. female MRN [id number]  Date of birth: 06/12/1975 ? ?SUBJECTIVE:  Including CC & ROS.  ?No chief complaint on file. ? ? ?Tanya Webb is a 46 y.o. female that is presenting with acute on chronic right carpal tunnel syndrome.  Her symptoms are similar to previously experienced symptoms.  No injury or inciting event. ? ? ? ?Review of Systems ?See HPI  ? ?HISTORY: Past Medical, Surgical, Social, and Family History Reviewed & Updated per EMR.   ?Pertinent Historical Findings include: ? ?Past Medical History:  ?Diagnosis Date  ? Abdominal pain   ? Anxiety   ? History of kidney stones   ? ? ?Past Surgical History:  ?Procedure Laterality Date  ? CHOLECYSTECTOMY N/A 04/01/2016  ? Procedure: LAPAROSCOPIC CHOLECYSTECTOMY WITH INTRAOPERATIVE CHOLANGIOGRAM;  Surgeon: Erroll Luna, MD;  Location: [facility];  Service: General;  Laterality: N/A;  ? CHOLECYSTECTOMY  05/2016  ? CYSTOSCOPY W/ URETEROSCOPY W/ LITHOTRIPSY    ? DILATION AND CURETTAGE OF UTERUS    ? IR RADIOLOGIST EVAL & MGMT  04/10/2021  ? UMBILICAL HERNIA REPAIR N/A 04/01/2016  ? Procedure: HERNIA REPAIR UMBILICAL ADULT;  Surgeon: Erroll Luna, MD;  Location: [facility];  Service: General;  Laterality: N/A;  ? ? ? ?PHYSICAL EXAM:  ?VS: BP 120/78 (BP Location: Left Arm, Patient Position: Sitting)   Ht '5\' 7"'$  (1.702 m)   Wt 162 lb (73.5 kg)   BMI 25.37 kg/m?  ?Physical Exam ?Gen: NAD, alert, cooperative with exam, well-appearing ?MSK:  ?Neurovascularly intact   ? ?Limited ultrasound: Right wrist: ? ?Median nerve appears to be flattening in the carpal tunnel. ? ?Summary: Findings consistent with carpal tunnel syndrome. ? ?Ultrasound and interpretation by Tanya Coots, MD ? ? ?Aspiration/Injection Procedure Note ?Tanya Webb ?[birth year] ? ?Procedure: Injection ?Indications: Right carpal tunnel syndrome ? ?Procedure Details ?Consent: Risks of procedure as well as the alternatives and risks of each were explained to the (patient/caregiver).   Consent for procedure obtained. ?Time Out: Verified patient identification, verified procedure, site/side was marked, verified correct patient position, special equipment/implants available, medications/allergies/relevent history reviewed, required imaging and test results available.  Performed.  The area was cleaned with iodine and alcohol swabs.   ? ?The right carpal tunnel was injected using 1 cc's of 40 mg Depo-Medrol and 2 cc's of 0.25% bupivacaine with a 25 1 1/2" needle.  Ultrasound was used. Images were obtained in long views showing the injection.   ? ? ?A sterile dressing was applied. ? ?Patient did tolerate procedure well. ? ? ? ?ASSESSMENT & PLAN:  ? ?Carpal tunnel syndrome, right ?Acute on chronic in nature.  Similar to her previous bouts of carpal tunnel syndrome. ?-Counseled on home exercise therapy and supportive care. ?-Injection today. ?-Could consider referral for release. ? ? ? ? ?

## 2021-10-02 NOTE — Addendum Note (Signed)
Addended by: Cresenciano Lick on: 10/02/2021 02:15 PM ? ? Modules accepted: Orders ? ?

## 2021-10-15 ENCOUNTER — Encounter: Payer: Self-pay | Admitting: Family Medicine

## 2021-10-29 ENCOUNTER — Encounter: Payer: Self-pay | Admitting: Family Medicine

## 2021-11-04 ENCOUNTER — Encounter: Payer: Self-pay | Admitting: *Deleted

## 2021-12-15 ENCOUNTER — Ambulatory Visit: Payer: 59 | Admitting: Family Medicine

## 2021-12-15 ENCOUNTER — Ambulatory Visit: Payer: Self-pay

## 2021-12-15 VITALS — BP 122/82 | Ht 67.0 in | Wt 160.0 lb

## 2021-12-15 DIAGNOSIS — G5602 Carpal tunnel syndrome, left upper limb: Secondary | ICD-10-CM

## 2021-12-15 MED ORDER — BETAMETHASONE SOD PHOS & ACET 6 (3-3) MG/ML IJ SUSP
6.0000 mg | Freq: Once | INTRAMUSCULAR | Status: AC
  Administered 2021-12-15: 6 mg via INTRA_ARTICULAR

## 2021-12-15 NOTE — Assessment & Plan Note (Signed)
Acute on chronic in nature. -Counseled on home exercise therapy and supportive care. -Injection today.

## 2021-12-15 NOTE — Progress Notes (Signed)
  Tanya Webb - 46 y.o. female MRN [id number]  Date of birth: 11-22-1975  SUBJECTIVE:  Including CC & ROS.  No chief complaint on file.   Tanya Webb is a 46 y.o. female that is presenting with acute on chronic wrist pain.   Review of Systems See HPI   HISTORY: Past Medical, Surgical, Social, and Family History Reviewed & Updated per EMR.   Pertinent Historical Findings include:  Past Medical History:  Diagnosis Date   Abdominal pain    Anxiety    History of kidney stones     Past Surgical History:  Procedure Laterality Date   CHOLECYSTECTOMY N/A 04/01/2016   Procedure: LAPAROSCOPIC CHOLECYSTECTOMY WITH INTRAOPERATIVE CHOLANGIOGRAM;  Surgeon: Erroll Luna, MD;  Location: [facility];  Service: General;  Laterality: N/A;   CHOLECYSTECTOMY  05/2016   CYSTOSCOPY W/ URETEROSCOPY W/ LITHOTRIPSY     DILATION AND CURETTAGE OF UTERUS     IR RADIOLOGIST EVAL & MGMT  60/63/0160   UMBILICAL HERNIA REPAIR N/A 04/01/2016   Procedure: HERNIA REPAIR UMBILICAL ADULT;  Surgeon: Erroll Luna, MD;  Location: [facility];  Service: General;  Laterality: N/A;     PHYSICAL EXAM:  VS: BP 122/82   Ht '5\' 7"'$  (1.702 m)   Wt 160 lb (72.6 kg)   BMI 25.06 kg/m  Physical Exam Gen: NAD, alert, cooperative with exam, well-appearing MSK:  Neurovascularly intact    Limited ultrasound: Left wrist:  No acute structural changes of the median nerve.  Summary: Acute on chronic carpal tunnel syndrome.  Ultrasound and interpretation by Clearance Coots, MD  Aspiration/Injection Procedure Note Tanya Webb January 23, 1976  Procedure: Injection Indications: Left carpal tunnel.  Procedure Details Consent: Risks of procedure as well as the alternatives and risks of each were explained to the (patient/caregiver).  Consent for procedure obtained. Time Out: Verified patient identification, verified procedure, site/side was marked, verified correct patient position, special equipment/implants available,  medications/allergies/relevent history reviewed, required imaging and test results available.  Performed.  The area was cleaned with iodine and alcohol swabs.    The left carpal tunnel was injected using 1 cc's of 6 mg betamethasone, 2 cc of normal saline, 2 cc of D5W, and 2 cc's of 0.25% bupivacaine with a 25 1 1/2" needle.  Ultrasound was used. Images were obtained in long views showing the injection.     A sterile dressing was applied.  Patient did tolerate procedure well.     ASSESSMENT & PLAN:   Carpal tunnel syndrome, left Acute on chronic in nature. -Counseled on home exercise therapy and supportive care. -Injection today.

## 2021-12-15 NOTE — Assessment & Plan Note (Signed)
>>  ASSESSMENT AND PLAN FOR CARPAL TUNNEL SYNDROME, LEFT WRITTEN ON 12/15/2021  2:49 PM BY SCHMITZ, JEREMY E, MD  Acute on chronic in nature. -Counseled on home exercise therapy and supportive care. -Injection today.

## 2021-12-15 NOTE — Patient Instructions (Signed)
Good to see you Please use ice as needed  Please send me a message in MyChart with any questions or updates.  Please see me back in one month.   --Dr. Raeford Razor

## 2022-01-29 ENCOUNTER — Ambulatory Visit: Payer: 59 | Admitting: Family Medicine

## 2022-02-16 ENCOUNTER — Ambulatory Visit: Payer: 59 | Admitting: Family Medicine

## 2022-02-16 ENCOUNTER — Encounter: Payer: Self-pay | Admitting: Family Medicine

## 2022-02-16 ENCOUNTER — Ambulatory Visit: Payer: Self-pay

## 2022-02-16 VITALS — BP 132/96 | Ht 67.0 in | Wt 160.0 lb

## 2022-02-16 DIAGNOSIS — G5602 Carpal tunnel syndrome, left upper limb: Secondary | ICD-10-CM

## 2022-02-16 DIAGNOSIS — G5601 Carpal tunnel syndrome, right upper limb: Secondary | ICD-10-CM

## 2022-02-16 MED ORDER — BETAMETHASONE SOD PHOS & ACET 6 (3-3) MG/ML IJ SUSP
6.0000 mg | Freq: Once | INTRAMUSCULAR | Status: AC
  Administered 2022-02-16: 6 mg via INTRA_ARTICULAR

## 2022-02-16 NOTE — Patient Instructions (Signed)
Good to see you  Please send me a message in MyChart with any questions or updates.  Please see me back in 3 months .   --Dr. Raeford Razor

## 2022-02-16 NOTE — Assessment & Plan Note (Signed)
>>  ASSESSMENT AND PLAN FOR CARPAL TUNNEL SYNDROME, RIGHT WRITTEN ON 02/16/2022  5:29 PM BY SCHMITZ, JEREMY E, MD  Acutely occurring. Exacerbation of her underlying carpal tunnel  - counseled on home exercise therapy and supportive care - injection  - may need to consider referral for surgery going forward.

## 2022-02-16 NOTE — Progress Notes (Signed)
  Tanya Webb - 46 y.o. female MRN [id number]  Date of birth: September 28, 1975  SUBJECTIVE:  Including CC & ROS.  No chief complaint on file.   Tanya Webb is a 46 y.o. female that is presenting with acute worsening of her right carpal tunnel syndrome.  Her symptoms are becoming more in a daily basis.  No injury or inciting event.    Review of Systems See HPI   HISTORY: Past Medical, Surgical, Social, and Family History Reviewed & Updated per EMR.   Pertinent Historical Findings include:  Past Medical History:  Diagnosis Date   Abdominal pain    Anxiety    History of kidney stones     Past Surgical History:  Procedure Laterality Date   CHOLECYSTECTOMY N/A 04/01/2016   Procedure: LAPAROSCOPIC CHOLECYSTECTOMY WITH INTRAOPERATIVE CHOLANGIOGRAM;  Surgeon: Erroll Luna, MD;  Location: [facility];  Service: General;  Laterality: N/A;   CHOLECYSTECTOMY  05/2016   CYSTOSCOPY W/ URETEROSCOPY W/ LITHOTRIPSY     DILATION AND CURETTAGE OF UTERUS     IR RADIOLOGIST EVAL & MGMT  35/70/1779   UMBILICAL HERNIA REPAIR N/A 04/01/2016   Procedure: HERNIA REPAIR UMBILICAL ADULT;  Surgeon: Erroll Luna, MD;  Location: [facility];  Service: General;  Laterality: N/A;     PHYSICAL EXAM:  VS: BP (!) 132/96 (BP Location: Left Arm, Patient Position: Sitting)   Ht '5\' 7"'$  (1.702 m)   Wt 160 lb (72.6 kg)   BMI 25.06 kg/m  Physical Exam Gen: NAD, alert, cooperative with exam, well-appearing MSK:  Neurovascularly intact    Limited ultrasound: Right wrist:  Flattening of the median nerve is observed. There is hypoechoic change in the long axis view.  Summary: Findings consistent with carpal tunnel syndrome.  Ultrasound and interpretation by Clearance Coots, MD  Aspiration/Injection Procedure Note Tanya Webb 07-27-1975   Procedure: Injection Indications: right carpal tunnel.   Procedure Details Consent: Risks of procedure as well as the alternatives and risks of each were explained to the  (patient/caregiver).  Consent for procedure obtained. Time Out: Verified patient identification, verified procedure, site/side was marked, verified correct patient position, special equipment/implants available, medications/allergies/relevent history reviewed, required imaging and test results available.  Performed.  The area was cleaned with iodine and alcohol swabs.     The right carpal tunnel was injected using 1 cc's of 6 mg betamethasone, 2 cc of normal saline, 2 cc of D5W, and 2 cc's of 0.25% bupivacaine with a 25 1 1/2" needle.  Ultrasound was used. Images were obtained in long views showing the injection.       A sterile dressing was applied.   Patient did tolerate procedure well.     ASSESSMENT & PLAN:   Carpal tunnel syndrome, right Acutely occurring. Exacerbation of her underlying carpal tunnel  - counseled on home exercise therapy and supportive care - injection  - may need to consider referral for surgery going forward.

## 2022-02-16 NOTE — Assessment & Plan Note (Signed)
Acutely occurring. Exacerbation of her underlying carpal tunnel  - counseled on home exercise therapy and supportive care - injection  - may need to consider referral for surgery going forward.

## 2022-03-16 ENCOUNTER — Ambulatory Visit: Payer: Self-pay | Admitting: Surgery

## 2022-03-24 ENCOUNTER — Other Ambulatory Visit: Payer: Self-pay | Admitting: Interventional Radiology

## 2022-03-24 DIAGNOSIS — D259 Leiomyoma of uterus, unspecified: Secondary | ICD-10-CM

## 2022-04-01 ENCOUNTER — Other Ambulatory Visit: Payer: Self-pay | Admitting: Interventional Radiology

## 2022-04-01 DIAGNOSIS — D259 Leiomyoma of uterus, unspecified: Secondary | ICD-10-CM

## 2022-04-13 ENCOUNTER — Ambulatory Visit: Payer: 59 | Admitting: Family Medicine

## 2022-04-13 ENCOUNTER — Ambulatory Visit: Payer: Self-pay

## 2022-04-13 VITALS — BP 128/86 | Ht 67.0 in | Wt 165.0 lb

## 2022-04-13 DIAGNOSIS — G5602 Carpal tunnel syndrome, left upper limb: Secondary | ICD-10-CM | POA: Diagnosis not present

## 2022-04-13 MED ORDER — BETAMETHASONE SOD PHOS & ACET 6 (3-3) MG/ML IJ SUSP
6.0000 mg | Freq: Once | INTRAMUSCULAR | Status: AC
  Administered 2022-04-13: 6 mg via INTRA_ARTICULAR

## 2022-04-13 NOTE — Progress Notes (Signed)
  Tanya Webb - 46 y.o. female MRN [id number]  Date of birth: 10-Jan-1976  SUBJECTIVE:  Including CC & ROS.  No chief complaint on file.   Tanya Webb is a 46 y.o. female that is presenting with acute worsening of her left carpal tunnel syndrome.    Review of Systems See HPI   HISTORY: Past Medical, Surgical, Social, and Family History Reviewed & Updated per EMR.   Pertinent Historical Findings include:  Past Medical History:  Diagnosis Date   Abdominal pain    Anxiety    History of kidney stones     Past Surgical History:  Procedure Laterality Date   CHOLECYSTECTOMY N/A 04/01/2016   Procedure: LAPAROSCOPIC CHOLECYSTECTOMY WITH INTRAOPERATIVE CHOLANGIOGRAM;  Surgeon: Erroll Luna, MD;  Location: [facility];  Service: General;  Laterality: N/A;   CHOLECYSTECTOMY  05/2016   CYSTOSCOPY W/ URETEROSCOPY W/ LITHOTRIPSY     DILATION AND CURETTAGE OF UTERUS     IR RADIOLOGIST EVAL & MGMT  26/83/4196   UMBILICAL HERNIA REPAIR N/A 04/01/2016   Procedure: HERNIA REPAIR UMBILICAL ADULT;  Surgeon: Erroll Luna, MD;  Location: [facility];  Service: General;  Laterality: N/A;     PHYSICAL EXAM:  VS: BP 128/86   Ht '5\' 7"'$  (1.702 m)   Wt 165 lb (74.8 kg)   BMI 25.84 kg/m  Physical Exam Gen: NAD, alert, cooperative with exam, well-appearing MSK:  Neurovascularly intact    Limited ultrasound: Left wrist:  Enlargement of the median nerve as well as flattening appreciated within the carpal tunnel.  Summary: Findings consistent with carpal tunnel syndrome.  Ultrasound and interpretation by Clearance Coots, MD   Aspiration/Injection Procedure Note Tanya Webb Feb 03, 1976  Procedure: Injection Indications: Left carpal tunnel syndrome  Procedure Details Consent: Risks of procedure as well as the alternatives and risks of each were explained to the (patient/caregiver).  Consent for procedure obtained. Time Out: Verified patient identification, verified procedure, site/side was  marked, verified correct patient position, special equipment/implants available, medications/allergies/relevent history reviewed, required imaging and test results available.  Performed.  The area was cleaned with iodine and alcohol swabs.    The left carpal tunnel was injected using 2 cc of normal saline, 2 cc of D5W,1 cc's of 6 mg betamethasone and 2 cc's of 0.25% bupivacaine with a 25 1 1/2" needle.  Ultrasound was used. Images were obtained in long views showing the injection.     A sterile dressing was applied.  Patient did tolerate procedure well.    ASSESSMENT & PLAN:   Carpal tunnel syndrome, left Acute on chronic in nature.  Exacerbation of underlying carpal tunnel syndrome. -Counseled on home exercise therapy and supportive care. -Injection today.

## 2022-04-13 NOTE — Assessment & Plan Note (Signed)
>>  ASSESSMENT AND PLAN FOR CARPAL TUNNEL SYNDROME, LEFT WRITTEN ON 04/13/2022 12:37 PM BY SCHMITZ, JEREMY E, MD  Acute on chronic in nature.  Exacerbation of underlying carpal tunnel syndrome. -Counseled on home exercise therapy and supportive care. -Injection today.

## 2022-04-13 NOTE — Patient Instructions (Signed)
Good to see you  We could do the right side in another month Please send me a message in MyChart with any questions or updates.  Please see me back as needed.   --Dr. Raeford Razor

## 2022-04-13 NOTE — Assessment & Plan Note (Signed)
Acute on chronic in nature.  Exacerbation of underlying carpal tunnel syndrome. -Counseled on home exercise therapy and supportive care. -Injection today.

## 2022-04-14 ENCOUNTER — Ambulatory Visit: Payer: 59 | Admitting: Family Medicine

## 2022-04-15 ENCOUNTER — Other Ambulatory Visit (HOSPITAL_COMMUNITY): Payer: Self-pay | Admitting: Interventional Radiology

## 2022-04-15 ENCOUNTER — Encounter: Payer: Self-pay | Admitting: Lab

## 2022-04-15 ENCOUNTER — Other Ambulatory Visit: Payer: Self-pay | Admitting: Interventional Radiology

## 2022-04-15 ENCOUNTER — Ambulatory Visit
Admission: RE | Admit: 2022-04-15 | Discharge: 2022-04-15 | Disposition: A | Discharge status: Home / Self Care | Payer: 59 | Source: Ambulatory Visit | Attending: Interventional Radiology | Admitting: Interventional Radiology

## 2022-04-15 DIAGNOSIS — D259 Leiomyoma of uterus, unspecified: Secondary | ICD-10-CM

## 2022-04-15 HISTORY — PX: IR RADIOLOGIST EVAL & MGMT: IMG5224

## 2022-04-15 NOTE — Progress Notes (Signed)
Chief Complaint: Patient was consulted remotely today (TeleHealth) for follow-up for uterine fibroid embolization.  History of Present Illness: Tanya Webb is a 46 y.o. female seen previously on 04/10/2021 for possible uterine fibroid embolization secondary to significant menorrhagia with associated cramping and pelvic pain related to multiple uterine fibroids.  At that time a pelvic MRI was recommended to evaluate for candidacy for possible uterine fibroid embolization.  The patient did not immediately pursue MRI but due to persistent menorrhagia did undergo MRI of the pelvis on 04/14/2022 and is now interested in pursuing fibroid embolization.  Past Medical History:  Diagnosis Date   Abdominal pain    Anxiety    History of kidney stones     Past Surgical History:  Procedure Laterality Date   CHOLECYSTECTOMY N/A 04/01/2016   Procedure: LAPAROSCOPIC CHOLECYSTECTOMY WITH INTRAOPERATIVE CHOLANGIOGRAM;  Surgeon: Erroll Luna, MD;  Location: [facility];  Service: General;  Laterality: N/A;   CHOLECYSTECTOMY  05/2016   CYSTOSCOPY W/ URETEROSCOPY W/ LITHOTRIPSY     DILATION AND CURETTAGE OF UTERUS     IR RADIOLOGIST EVAL & MGMT  78/93/8101   UMBILICAL HERNIA REPAIR N/A 04/01/2016   Procedure: HERNIA REPAIR UMBILICAL ADULT;  Surgeon: Erroll Luna, MD;  Location: [facility];  Service: General;  Laterality: N/A;    Allergies: Bee venom  Medications: Prior to Admission medications   Medication Sig Start Date End Date Taking? Authorizing Provider  FLUoxetine (PROZAC) 20 MG tablet TAKE ONE TABLET BY MOUTH DAILY 04/29/21   Lind Covert, MD  Multiple Vitamin (MULTIVITAMIN WITH MINERALS) TABS tablet Take 2 tablets by mouth daily. olly    [provider]  phentermine 37.5 MG capsule Take 37.5 mg by mouth every morning.    [provider]  valACYclovir (VALTREX) 1000 MG tablet Take 2 tablets (2,000 mg total) by mouth 2 (two) times daily. For one day at the onset of  cold sore symptoms Patient taking differently: Take 2,000 mg by mouth daily as needed (For one day at the onset of cold sore symptoms). 08/22/19   Lind Covert, MD     Family History  Problem Relation Age of Onset   Diabetes Father    Heart disease Father    Diabetes Brother     Social History   Socioeconomic History   Marital status: Married    Spouse name: Not on file   Number of children: Not on file   Years of education: Not on file   Highest education level: Not on file  Occupational History   Not on file  Tobacco Use   Smoking status: Never   Smokeless tobacco: Never  Substance and Sexual Activity   Alcohol use: Yes    Comment: weekends- sometimes   Drug use: No   Sexual activity: Yes    Birth control/protection: None, Surgical  Other Topics Concern   Not on file  Social History Narrative   Not on file   Social Determinants of Health   Financial Resource Strain: Not on file  Food Insecurity: Not on file  Transportation Needs: Not on file  Physical Activity: Not on file  Stress: Not on file  Social Connections: Not on file    Review of Systems  Constitutional: Negative.   Respiratory: Negative.    Cardiovascular: Negative.   Gastrointestinal: Negative.   Genitourinary:  Positive for frequency, menstrual problem and pelvic pain.  Musculoskeletal: Negative.   Neurological: Negative.     Review of Systems: A 12 point  ROS discussed and pertinent positives are indicated in the HPI above.  All other systems are negative.    Physical Exam No direct physical exam was performed (except for noted visual exam findings with Video Visits).   Vital Signs: There were no vitals taken for this visit.  Imaging: No results found. See below.   Assessment and Plan:  I spoke with [name] over the phone.  We reviewed MRI findings from the pelvic MRI dated 04/14/2022.  This demonstrates significant uterine enlargement with estimated volume of 962 mL and  multiple uterine fibroids numbering at least 6-7 in total.  The largest is an anterior pedunculated fundal fibroid measuring up to approximately 6.9 cm in maximum diameter.  Left and right-sided fibroids measuring up to 4.5 cm and additional smaller fibroids are present.  The largest fibroid is subserosal as is a posterior fibroid but demonstrates broad attachment to the myometrium and morphology is amenable to embolization.  All fibroids demonstrate avid contrast enhancement after administration of gadolinium.  After discussion, [name] is interested in scheduling fibroid embolization and this has already been [facility] at [facility] for [facility]07/2022.  She will be admitted for overnight observation following the procedure.  Electronically Signed: Azzie Roup 04/15/2022, 10:42 AM    I spent a total of 10 Minutes in remote  clinical consultation, greater than 50% of which was counseling/coordinating care for fibroid embolization.    Visit type: Audio only (telephone). Audio (no video) only due to patient's lack of internet/smartphone capability. Alternative for in-person consultation at [facility], [address] [place], [place], [place]. This visit type was conducted due to national recommendations for restrictions regarding the COVID-19 Pandemic (e.g. social distancing).  This format is felt to be most appropriate for this patient at this time.  All issues noted in this document were discussed and addressed.

## 2022-05-07 ENCOUNTER — Other Ambulatory Visit (HOSPITAL_COMMUNITY): Payer: 59

## 2022-05-07 ENCOUNTER — Ambulatory Visit (HOSPITAL_COMMUNITY): Payer: 59

## 2022-05-18 ENCOUNTER — Encounter: Payer: Self-pay | Admitting: Family Medicine

## 2022-05-18 ENCOUNTER — Ambulatory Visit: Payer: 59 | Admitting: Family Medicine

## 2022-05-18 ENCOUNTER — Ambulatory Visit: Payer: Self-pay

## 2022-05-18 VITALS — BP 124/64 | Ht 67.0 in | Wt 170.0 lb

## 2022-05-18 DIAGNOSIS — G5601 Carpal tunnel syndrome, right upper limb: Secondary | ICD-10-CM

## 2022-05-18 MED ORDER — BETAMETHASONE SOD PHOS & ACET 6 (3-3) MG/ML IJ SUSP
6.0000 mg | Freq: Once | INTRAMUSCULAR | Status: AC
  Administered 2022-05-18: 6 mg via INTRA_ARTICULAR

## 2022-05-18 NOTE — Assessment & Plan Note (Signed)
Acute on chronic in nature.  We have performed multiple injections and her symptoms continue. -Counseled on home exercise therapy and supportive care. -Injection today. -Would pursue surgery going forward.

## 2022-05-18 NOTE — Assessment & Plan Note (Signed)
>>  ASSESSMENT AND PLAN FOR CARPAL TUNNEL SYNDROME, RIGHT WRITTEN ON 05/18/2022  9:42 AM BY SCHMITZ, JEREMY E, MD  Acute on chronic in nature.  We have performed multiple injections and her symptoms continue. -Counseled on home exercise therapy and supportive care. -Injection today. -Would pursue surgery going forward.

## 2022-05-18 NOTE — Patient Instructions (Signed)
Good to see you Please let me know when you want to see a surgeon   Please send me a message in [place] with any questions or updates.  Please see me back as needed.   --Dr. Raeford Razor

## 2022-05-18 NOTE — Progress Notes (Signed)
  Tanya Webb - 46 y.o. female MRN [id number]  Date of birth: August 20, 1975  SUBJECTIVE:  Including CC & ROS.  No chief complaint on file.   Tanya Webb is a 46 y.o. female that is presenting with acute on chronic right wrist pain and numbness.  Symptoms are acutely occurring.  Similar to her previous symptoms.   Review of Systems See HPI   HISTORY: Past Medical, Surgical, Social, and Family History Reviewed & Updated per EMR.   Pertinent Historical Findings include:  Past Medical History:  Diagnosis Date   Abdominal pain    Anxiety    History of kidney stones     Past Surgical History:  Procedure Laterality Date   CHOLECYSTECTOMY N/A 04/01/2016   Procedure: LAPAROSCOPIC CHOLECYSTECTOMY WITH INTRAOPERATIVE CHOLANGIOGRAM;  Surgeon: Erroll Luna, MD;  Location: [facility];  Service: General;  Laterality: N/A;   CHOLECYSTECTOMY  05/2016   CYSTOSCOPY W/ URETEROSCOPY W/ LITHOTRIPSY     DILATION AND CURETTAGE OF UTERUS     IR RADIOLOGIST EVAL & MGMT  04/10/2021   IR RADIOLOGIST EVAL & MGMT  53/20/2334   UMBILICAL HERNIA REPAIR N/A 04/01/2016   Procedure: HERNIA REPAIR UMBILICAL ADULT;  Surgeon: Erroll Luna, MD;  Location: [facility];  Service: General;  Laterality: N/A;     PHYSICAL EXAM:  VS: BP 124/64   Ht '5\' 7"'$  (1.702 m)   Wt 170 lb (77.1 kg)   BMI 26.63 kg/m  Physical Exam Gen: NAD, alert, cooperative with exam, well-appearing MSK:  Neurovascularly intact    Limited ultrasound: Right wrist pain:  Median nerve measured within the carpal tunnel shows flattening.  Summary: Findings consistent with carpal tunnel syndrome.  Ultrasound and interpretation by Clearance Coots, MD  Aspiration/Injection Procedure Note Tanya Webb [birth year]  Procedure: Injection Indications: Right wrist pain  Procedure Details Consent: Risks of procedure as well as the alternatives and risks of each were explained to the (patient/caregiver).  Consent for procedure obtained. Time Out:  Verified patient identification, verified procedure, site/side was marked, verified correct patient position, special equipment/implants available, medications/allergies/relevent history reviewed, required imaging and test results available.  Performed.  The area was cleaned with iodine and alcohol swabs.    The right carpal tunnel was injected with 2 cc of normal saline, 2 cc of D5W, 1 cc of 6 mg of betamethasone, and 2 cc of 0.25% bupivacaine on a 25-gauge 1-1/2 inch needle.  Ultrasound was used. Images were obtained in long views showing the injection.     A sterile dressing was applied.  Patient did tolerate procedure well.     ASSESSMENT & PLAN:   Carpal tunnel syndrome, right Acute on chronic in nature.  We have performed multiple injections and her symptoms continue. -Counseled on home exercise therapy and supportive care. -Injection today. -Would pursue surgery going forward.

## 2022-05-21 ENCOUNTER — Other Ambulatory Visit (HOSPITAL_COMMUNITY): Payer: 59

## 2022-05-21 ENCOUNTER — Ambulatory Visit (HOSPITAL_COMMUNITY): Payer: 59

## 2022-08-03 ENCOUNTER — Ambulatory Visit: Payer: 59 | Admitting: Family Medicine

## 2022-08-17 ENCOUNTER — Other Ambulatory Visit: Payer: Self-pay

## 2022-08-17 ENCOUNTER — Ambulatory Visit: Payer: 59 | Admitting: Family Medicine

## 2022-08-17 VITALS — BP 130/86 | Ht 67.0 in | Wt 165.0 lb

## 2022-08-17 DIAGNOSIS — G5601 Carpal tunnel syndrome, right upper limb: Secondary | ICD-10-CM | POA: Diagnosis not present

## 2022-08-17 MED ORDER — BETAMETHASONE SOD PHOS & ACET 6 (3-3) MG/ML IJ SUSP
6.0000 mg | Freq: Once | INTRAMUSCULAR | Status: AC
  Administered 2022-08-17: 6 mg via INTRA_ARTICULAR

## 2022-08-17 NOTE — Assessment & Plan Note (Signed)
Acute on chronic in nature.  She is having exacerbation of her underlying carpal tunnel syndrome.  The median nerve is showing flattening within the carpal tunnel. -Counseled on home exercise therapy and supportive care. -Injection today. -Could consider PRP going forward with a preprocedure Valium.

## 2022-08-17 NOTE — Assessment & Plan Note (Signed)
>>  ASSESSMENT AND PLAN FOR CARPAL TUNNEL SYNDROME, RIGHT WRITTEN ON 08/17/2022  2:17 PM BY SCHMITZ, JEREMY E, MD  Acute on chronic in nature.  She is having exacerbation of her underlying carpal tunnel syndrome.  The median nerve is showing flattening within the carpal tunnel. -Counseled on home exercise therapy and supportive care. -Injection today. -Could consider PRP going forward with a preprocedure Valium.

## 2022-08-17 NOTE — Progress Notes (Signed)
  Tanya Webb - 47 y.o. female MRN [id number]  Date of birth: [birth year]  SUBJECTIVE:  Including CC & ROS.  No chief complaint on file.   Tanya Webb is a 47 y.o. female that is presenting with acute on chronic right carpal tunnel syndrome.  No injury or inciting event.  The pain is similar to her previous type pain.    Review of Systems See HPI   HISTORY: Past Medical, Surgical, Social, and Family History Reviewed & Updated per EMR.   Pertinent Historical Findings include:  Past Medical History:  Diagnosis Date   Abdominal pain    Anxiety    History of kidney stones     Past Surgical History:  Procedure Laterality Date   CHOLECYSTECTOMY N/A 04/01/2016   Procedure: LAPAROSCOPIC CHOLECYSTECTOMY WITH INTRAOPERATIVE CHOLANGIOGRAM;  Surgeon: Erroll Luna, MD;  Location: [facility];  Service: General;  Laterality: N/A;   CHOLECYSTECTOMY  05/2016   CYSTOSCOPY W/ URETEROSCOPY W/ LITHOTRIPSY     DILATION AND CURETTAGE OF UTERUS     IR RADIOLOGIST EVAL & MGMT  04/10/2021   IR RADIOLOGIST EVAL & MGMT  99991111   UMBILICAL HERNIA REPAIR N/A 04/01/2016   Procedure: HERNIA REPAIR UMBILICAL ADULT;  Surgeon: Erroll Luna, MD;  Location: [facility];  Service: General;  Laterality: N/A;     PHYSICAL EXAM:  VS: BP 130/86   Ht 5\' 7"  (1.702 m)   Wt 165 lb (74.8 kg)   BMI 25.84 kg/m  Physical Exam Gen: NAD, alert, cooperative with exam, well-appearing MSK:  Neurovascularly intact    Limited ultrasound: Right wrist pain:  The median nerve measures 0.09 cm  Summary: Findings consistent with carpal tunnel syndrome.  Ultrasound and interpretation by Clearance Coots, MD   Aspiration/Injection Procedure Note LISSETE ROOKER 1975-09-08  Procedure: Injection Indications: right carpal tunnel syndrome   Procedure Details Consent: Risks of procedure as well as the alternatives and risks of each were explained to the (patient/caregiver).  Consent for procedure obtained. Time Out:  Verified patient identification, verified procedure, site/side was marked, verified correct patient position, special equipment/implants available, medications/allergies/relevent history reviewed, required imaging and test results available.  Performed.  The area was cleaned with iodine and alcohol swabs.    The the right median nerve in the carpal tunnel was injected with 2 cc normal saline, 2 cc of D5W, 2 cc of 0.25% bupivacaine and 1 cc of 6 mg betamethasone on a 25-gauge 1-1/2 inch needle.  Ultrasound was used. Images were obtained in long views showing the injection.     A sterile dressing was applied.  Patient did tolerate procedure well.    ASSESSMENT & PLAN:   Carpal tunnel syndrome, right Acute on chronic in nature.  She is having exacerbation of her underlying carpal tunnel syndrome.  The median nerve is showing flattening within the carpal tunnel. -Counseled on home exercise therapy and supportive care. -Injection today. -Could consider PRP going forward with a preprocedure Valium.

## 2022-08-17 NOTE — Patient Instructions (Signed)
Good to see you We can do PRP in the future   Please send me a message in MyChart with any questions or updates.  Please see me back as needed.   --Dr. Raeford Razor

## 2022-09-14 ENCOUNTER — Encounter: Payer: Self-pay | Admitting: *Deleted

## 2023-03-10 ENCOUNTER — Ambulatory Visit: Payer: 59 | Admitting: Sports Medicine

## 2023-03-12 ENCOUNTER — Other Ambulatory Visit: Payer: Self-pay

## 2023-03-12 ENCOUNTER — Ambulatory Visit: Payer: 59 | Admitting: Family Medicine

## 2023-03-12 VITALS — BP 132/91 | Ht 67.0 in | Wt 160.0 lb

## 2023-03-12 DIAGNOSIS — G5601 Carpal tunnel syndrome, right upper limb: Secondary | ICD-10-CM | POA: Diagnosis not present

## 2023-03-12 DIAGNOSIS — M25531 Pain in right wrist: Secondary | ICD-10-CM

## 2023-03-12 MED ORDER — METHYLPREDNISOLONE ACETATE 40 MG/ML IJ SUSP
40.0000 mg | Freq: Once | INTRAMUSCULAR | Status: AC
  Administered 2023-03-12: 40 mg via INTRA_ARTICULAR

## 2023-03-12 NOTE — Patient Instructions (Signed)

## 2023-03-12 NOTE — Progress Notes (Addendum)
PCP: Carney Living, MD  Subjective:   HPI: Patient is a 47 y.o. female here for Carpal tunnel syndromes.  Patient states that she has gotten carpal tunnel injections regularly for quite some time now.  Patient symptoms are all located on her right.  Patient usually gets an injection every 3 to 4 months.  This is patient's approximately eighth injection.  I discussed with patient the need for possibly avoiding injection and going to surgery.  Patient does have upcoming surgery in December, so would like to proceed with injections today.  Patient is agreeable that going forward steroid injections will be avoided, will attempt to do PRP next time..    Past Medical History:  Diagnosis Date   Abdominal pain    Anxiety    History of kidney stones     Current Outpatient Medications on File Prior to Visit  Medication Sig Dispense Refill   FLUoxetine (PROZAC) 20 MG tablet TAKE ONE TABLET BY MOUTH DAILY 90 tablet 0   Multiple Vitamin (MULTIVITAMIN WITH MINERALS) TABS tablet Take 2 tablets by mouth daily. olly     phentermine 37.5 MG capsule Take 37.5 mg by mouth every morning.     valACYclovir (VALTREX) 1000 MG tablet Take 2 tablets (2,000 mg total) by mouth 2 (two) times daily. For one day at the onset of cold sore symptoms (Patient taking differently: Take 2,000 mg by mouth daily as needed (For one day at the onset of cold sore symptoms).) 12 tablet 1   No current facility-administered medications on file prior to visit.    Past Surgical History:  Procedure Laterality Date   CHOLECYSTECTOMY N/A 04/01/2016   Procedure: LAPAROSCOPIC CHOLECYSTECTOMY WITH INTRAOPERATIVE CHOLANGIOGRAM;  Surgeon: Harriette Bouillon, MD;  Location[facility];  Service: General;  Laterality: N/A;   CHOLECYSTECTOMY  05/2016   CYSTOSCOPY W/ URETEROSCOPY W/ LITHOTRIPSY     DILATION AND CURETTAGE OF UTERUS     IR RADIOLOGIST EVAL & MGMT  04/10/2021   IR RADIOLOGIST EVAL & MGMT  04/15/2022   UMBILICAL HERNIA REPAIR N/A  04/01/2016   Procedure: HERNIA REPAIR UMBILICAL ADULT;  Surgeon: Harriette Bouillon, MD;  Location: [facility];  Service: General;  Laterality: N/A;    Allergies  Allergen Reactions   Bee Venom Anaphylaxis    BP (!) 132/91   Ht 5\' 7"  (1.702 m)   Wt 160 lb (72.6 kg)   BMI 25.06 kg/m       No data to display              No data to display              Objective:  Physical Exam:  Gen: NAD, comfortable in exam room   Wrist, Right: Inspection yielded no erythema, ecchymosis, bony deformity, or swelling. ROM full with good flexion and extension and ulnar/radial deviation that is symmetrical with opposite wrist. Palpation is normal over metacarpals, scaphoid, lunate, and TFCC; tendons without tenderness/swelling. Strength 5/5 in all directions without pain. Provocative testing demonstrates POS Finkelstein's, Phalen's, and Tinel's test.   Procedure: US-guided Carpal Tunnel Injection, Right Wrist After informed written consent and discussion on R/B/I, a timeout was performed, patient was seated on exam table with the affected arm placed in supine position on table. The area overlying the patient's carpal tunnel was prepped with chlorhexidine and alcohol swabs then utilizing ultrasound guidance via an in-plane approach, the patient's carpal tunnel was injected with a mixture of 3:1 lidocaine:betamethasone with hydrodissection of the median nerve from  the flexor retinaculum in 360 degree fashion. Visualization of the needle was achieved with a transverse, in-plane approach. Patient tolerated procedure well without immediate complications.    Assessment & Plan:  1. 1. Carpal tunnel syndrome, right - Discussed with patient that we will proceed with injection today however we will avoid future injections given patient has had a substantial amount.  Patient is agreeable, will consider doing PRP when symptoms return.  Patient will schedule for surgery sometime next year.  2. Right wrist pain  -  [place] LIMITED JOINT SPACE STRUCTURES UP RIGHT; Future    Brenton Grills MD, PGY-4  Sports Medicine Fellow [facility]  Addendum:  Patient seen and examined with fellow in the office.  History, exam, plan of care were precepted with me. I was present for entire procedure.   Patient with multiple carpal tunnel cortisone injections, last 08/17/22.  Agree with limiting future cortisone injections.  Consider PRP vs referral to surgeon to discuss carpal tunnel release.  Agree with findings in fellow note above.     Darene Lamer, DO, CAQSM

## 2023-05-10 ENCOUNTER — Other Ambulatory Visit: Payer: Self-pay | Admitting: Obstetrics and Gynecology

## 2023-05-11 LAB — SURGICAL PATHOLOGY

## 2023-11-05 ENCOUNTER — Encounter: Payer: Self-pay | Admitting: Family Medicine

## 2023-11-05 ENCOUNTER — Ambulatory Visit (INDEPENDENT_AMBULATORY_CARE_PROVIDER_SITE_OTHER): Admitting: Family Medicine

## 2023-11-05 VITALS — BP 108/72 | Ht 67.0 in | Wt 150.0 lb

## 2023-11-05 DIAGNOSIS — G5603 Carpal tunnel syndrome, bilateral upper limbs: Secondary | ICD-10-CM | POA: Diagnosis not present

## 2023-11-05 NOTE — Progress Notes (Signed)
  Tanya Webb - 48 y.o. female MRN [id number]  Date of birth: 1975/06/16    SUBJECTIVE:      Chief Complaint:/ HPI:  Bilateral but right greater than left wrist pain Patient reports longstanding carpal tunnel syndrome and has had multiple injections in both wrists.  Her last injection in the right wrist was October, 2024.  At that time she had surgery planned but has not yet had the surgical intervention. Symptoms are pain in the wrist as well as some numbness and tingling that can radiate up into the forearm.  No real problems with grip strength.  Has used cock up splints at night intermittently and they seem to help for a while but then she will stop using them.     OBJECTIVE: BP 108/72   Ht 5\' 7"  (1.702 m)   Wt 150 lb (68 kg)   BMI 23.49 kg/m   Physical Exam:  Vital signs are reviewed. GENERAL: Well-developed female no acute distress WRISTS: Bilaterally symmetrical.  Negative Tinel.  Negative Phalen bilaterally.  There is slight flattening of the thenar eminence of the left hand.  Grip strength is bilaterally normal.  Radial pulses are 2+ bilaterally equal. ULTRASOUND: Reviewed her ultrasound images from October 2024 which show the right median nerve to be flattened and asymmetrical.  ASSESSMENT & PLAN:  See problem based charting & AVS for pt instructions. Bilateral carpal tunnel syndrome Had a long discussion with her about risk benefit of Pete carpal tunnel injections.  I think she has a long past what is most likely beneficial or safe for her.  She had planned surgery but then ended up having to have a hysterectomy in says she will have surgery in the future but is still recovering from the hysterectomy so she is wanting some relief soon particularly in the right wrist.  She does benefit from cock up wrist splint so I told her to restart those every night all night.  Additionally I mention the possibility of shockwave therapy and she spoke briefly with Dr. Abraham Abo regarding how  that would need to be set up.  I showed her the imaging done on her right wrist.  Answered all questions.  She will follow-up as needed.  I would not recommend any further corticosteroid injections into either wrist.  A total of 35 minutes was spent including: patient interview, examination, review of chart and history, development of differential diagnoses, decision about testing, discussion of treatment and management options with patient and coordination of care.

## 2023-11-05 NOTE — Assessment & Plan Note (Signed)
 Had a long discussion with her about risk benefit of Pete carpal tunnel injections.  I think she has a long past what is most likely beneficial or safe for her.  She had planned surgery but then ended up having to have a hysterectomy in says she will have surgery in the future but is still recovering from the hysterectomy so she is wanting some relief soon particularly in the right wrist.  She does benefit from cock up wrist splint so I told her to restart those every night all night.  Additionally I mention the possibility of shockwave therapy and she spoke briefly with Dr. Abraham Abo regarding how that would need to be set up.  I showed her the imaging done on her right wrist.  Answered all questions.  She will follow-up as needed.  I would not recommend any further corticosteroid injections into either wrist.

## 2023-11-09 ENCOUNTER — Encounter: Payer: Self-pay | Admitting: Family Medicine

## 2023-11-09 ENCOUNTER — Ambulatory Visit (INDEPENDENT_AMBULATORY_CARE_PROVIDER_SITE_OTHER): Payer: Self-pay | Admitting: Family Medicine

## 2023-11-09 DIAGNOSIS — G5603 Carpal tunnel syndrome, bilateral upper limbs: Secondary | ICD-10-CM

## 2023-11-09 NOTE — Patient Instructions (Signed)

## 2023-11-09 NOTE — Progress Notes (Signed)
 FOLLOW-UP VISIT:  Shockwave Therapy Procedure   NAME:  LORINE IANNACCONE DOB:  Oct 02, 1975 MR#: [id number] DATE OF VISIT:  11/09/2023  Encounter:   Grafton Lawrence comes in for a follow up evaluation and 1st Radial Extracorporeal Shockwave therapy (ESWT) treatment to bilateral carpal tunnel.  Aaliayah reports multiple previous cortisone injections Has been told needs surgery, but has not yet been able to schedule  Considering within the next year depending on response to EST   ESWT Procedure Note: Treatment #1  Diagnosis: bilateral carpal tunnel syndrome  Procedure Details  Consent for the procedure was obtained. Time out was performed. The anatomical landmarks and target areas for the procedure were identified.   PROCEDURE: ESWT was discussed with the patient in detail.  The risk and benefits were explained.  The point of maximal tenderness was identified and the oscillator probe was applied with moderate pressure. Treatment adjusted as mediated by patient feedback/pain.  Bilateral carpal tunnel/volar wrist was targeted for ESWT  Power level: 80 MJ Frequency: 10 Hz Impulses: 1500 to each wrist Head size: large   Complications:  None; patient tolerated the procedure well.  ASSESSMENT/PLAN: 1. Bilateral carpal tunnel syndrome  Plan:   ESWT completed today as noted above Return in 1 week for ESWT procedure #2 -- anticipate 4-6 treatments overall Procedure aftercare reviewed Recommended avoiding strenuous activities and using OTC analgesia prn.

## 2023-11-16 ENCOUNTER — Ambulatory Visit (INDEPENDENT_AMBULATORY_CARE_PROVIDER_SITE_OTHER): Payer: Self-pay | Admitting: Family Medicine

## 2023-11-16 ENCOUNTER — Encounter: Payer: Self-pay | Admitting: Family Medicine

## 2023-11-16 DIAGNOSIS — G5603 Carpal tunnel syndrome, bilateral upper limbs: Secondary | ICD-10-CM

## 2023-11-16 NOTE — Progress Notes (Signed)
 FOLLOW-UP VISIT:  Shockwave Therapy Procedure   NAME:  Tanya Webb DOB:  Jun 26, 1975 MR#: [id number] DATE OF VISIT:  11/16/2023  Encounter:   Tanya Webb comes in for a follow up evaluation and 2nd Radial Extracorporeal Shockwave therapy (ESWT) treatment to the  b/l wrist/carpal tunnel.  Tanya Webb reports about 3-4 days improvement in symptoms.  Feeling looser around the wrist.     ESWT Procedure Note: Treatment #2  Diagnosis: bilateral carpal tunnel syndrome  Procedure Details  Consent for the procedure was obtained. Time out was performed. The anatomical landmarks and target areas for the procedure were identified.   PROCEDURE: ESWT was discussed with the patient in detail.  The risk and benefits were explained.  The point of maximal tenderness was identified and the oscillator probe was applied with moderate pressure. Treatment adjusted as mediated by patient feedback/pain.  Bilateral carpal tunnel was targeted for ESWT  Power level: 90 MJ Frequency: 12 Hz Impulses: 1500 Head size: large   Complications:  None; patient tolerated the procedure well.  ASSESSMENT/PLAN: 1. Bilateral carpal tunnel syndrome  Plan:   ESWT completed today as noted above Return in 1 week for ESWT procedure #3 Procedure aftercare reviewed Recommended avoiding strenuous activities and using OTC analgesia prn.

## 2023-11-16 NOTE — Patient Instructions (Signed)

## 2023-11-23 ENCOUNTER — Ambulatory Visit: Payer: Self-pay | Admitting: Family Medicine

## 2023-11-24 ENCOUNTER — Encounter: Payer: Self-pay | Admitting: Family Medicine

## 2023-11-24 ENCOUNTER — Ambulatory Visit (INDEPENDENT_AMBULATORY_CARE_PROVIDER_SITE_OTHER): Payer: Self-pay | Admitting: Family Medicine

## 2023-11-24 DIAGNOSIS — G5603 Carpal tunnel syndrome, bilateral upper limbs: Secondary | ICD-10-CM

## 2023-11-24 NOTE — Progress Notes (Unsigned)
 Tanya Webb - 48 y.o. female MRN [id number]  Date of birth: 1976-03-13  PCP: Justino Lonney Carte, FNP  Subjective:  No chief complaint on file.  Bilateral carpal tunnel  HPI: Past Medical, Surgical, Social, and Family History Reviewed & Updated per EMR.   Patient is a 48 y.o. female here for her 3rd shock wave session.  She noted relief after her previous session and no side effects afterwards.  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  She has been doing stretches but is not been using ice or heat.  She denies any worsening of pain, swelling, numbness, or weakness.  Past Medical History:  Diagnosis Date   Abdominal pain    Anxiety    History of kidney stones     Current Outpatient Medications on File Prior to Visit  Medication Sig Dispense Refill   FLUoxetine  (PROZAC ) 20 MG tablet TAKE ONE TABLET BY MOUTH DAILY 90 tablet 0   Multiple Vitamin (MULTIVITAMIN WITH MINERALS) TABS tablet Take 2 tablets by mouth daily. olly     phentermine 37.5 MG capsule Take 37.5 mg by mouth every morning.     valACYclovir  (VALTREX ) 1000 MG tablet Take 2 tablets (2,000 mg total) by mouth 2 (two) times daily. For one day at the onset of cold sore symptoms (Patient taking differently: Take 2,000 mg by mouth daily as needed (For one day at the onset of cold sore symptoms).) 12 tablet 1   No current facility-administered medications on file prior to visit.    Past Surgical History:  Procedure Laterality Date   CHOLECYSTECTOMY N/A 04/01/2016   Procedure: LAPAROSCOPIC CHOLECYSTECTOMY WITH INTRAOPERATIVE CHOLANGIOGRAM;  Surgeon: Debby Shipper, MD;  Location: [facility];  Service: General;  Laterality: N/A;   CHOLECYSTECTOMY  05/2016   CYSTOSCOPY W/ URETEROSCOPY W/ LITHOTRIPSY     DILATION AND CURETTAGE OF UTERUS     IR RADIOLOGIST EVAL & MGMT  04/10/2021   IR RADIOLOGIST EVAL & MGMT  04/15/2022   UMBILICAL HERNIA REPAIR N/A 04/01/2016   Procedure:  HERNIA REPAIR UMBILICAL ADULT;  Surgeon: Debby Shipper, MD;  Location[facility];  Service: General;  Laterality: N/A;    Allergies  Allergen Reactions   Bee Venom Anaphylaxis        Objective:  Physical Exam: VS: BP:   HR: bpm  TEMP: ( )  RESP:   HT:    WT:   BMI:   Gen: NAD, speaks clearly, comfortable in exam room Respiratory: Normal respiratory effort on room air. No signs of distress Skin: No rashes, abrasions, or ecchymosis MSK:  Inspection of the anterior bilateral wrists reveals no erythema, ecchymosis, soft tissue edema, or deformity No thenar wasting ROM: Full Strength: 5/5 in wrist flexion, extension, thumb abduction, and opposition Tinel's negative bilaterally No tenderness over the carpal bony prominences No sensory deficits    Assessment & Plan:   Bilateral carpal tunnel syndrome Procedure: ECSWT Indications:  Bilateral Carpal tunnel Syndrome   Procedure Details Consent: Risks of procedure as well as the alternatives and risks of each were explained to the patient.  Written consent for procedure obtained. Time Out: Verified patient identification, verified procedure, site was marked, verified correct patient position, medications/allergies/relevent history reviewed.  The area was cleaned with alcohol swab.     The bilateral carpal tunnel was targeted for Extracorporeal shockwave therapy.    Preset: none  Power Level: 90 MJ Frequency: 12 Hz Impulse/cycles: 1500 Head  size: Large   Patient tolerated procedure well without immediate complications.  Postprocedural care was discussed and printed material given. I discussed the average of 3-4 sessions to assess therapeutic results. We will see the patient back in 1 week for the 4th session.       Lonni Ford MD [facility] Sports Medicine Fellow

## 2023-11-24 NOTE — Assessment & Plan Note (Addendum)
 Procedure: ECSWT Indications:  Bilateral Carpal tunnel Syndrome   Procedure Details Consent: Risks of procedure as well as the alternatives and risks of each were explained to the patient.  Written consent for procedure obtained. Time Out: Verified patient identification, verified procedure, site was marked, verified correct patient position, medications/allergies/relevent history reviewed.  The area was cleaned with alcohol swab.     The bilateral carpal tunnel was targeted for Extracorporeal shockwave therapy.    Preset: none  Power Level: 90 MJ Frequency: 12 Hz Impulse/cycles: 1500 Head size: Large   Patient tolerated procedure well without immediate complications.  Postprocedural care was discussed and printed material given. I discussed the average of 3-4 sessions to assess therapeutic results. We will see the patient back in 1 week for the 4th session.

## 2023-11-25 NOTE — Patient Instructions (Signed)

## 2023-12-13 ENCOUNTER — Ambulatory Visit (INDEPENDENT_AMBULATORY_CARE_PROVIDER_SITE_OTHER): Payer: Self-pay | Admitting: Family Medicine

## 2023-12-13 DIAGNOSIS — G5603 Carpal tunnel syndrome, bilateral upper limbs: Secondary | ICD-10-CM

## 2023-12-13 NOTE — Progress Notes (Addendum)
 PCP: Justino Lonney Carte, FNP  Subjective:   HPI: Patient is a 48 y.o. female here for her 4th shockwave session in the setting of bilateral carpal tunnel syndrome.  She completed her third shockwave session on 6/25 and endorses symptomatic improvement, noting that her hands do not go numb as quickly while driving or with washing her hair.  Symptoms are worse in the right hand.  She has cock up wrist splints at home but does not wear them consistently.  She is completing stretches intermittently.  Past Medical History:  Diagnosis Date   Abdominal pain    Anxiety    History of kidney stones     Current Outpatient Medications on File Prior to Visit  Medication Sig Dispense Refill   FLUoxetine  (PROZAC ) 20 MG tablet TAKE ONE TABLET BY MOUTH DAILY 90 tablet 0   Multiple Vitamin (MULTIVITAMIN WITH MINERALS) TABS tablet Take 2 tablets by mouth daily. olly     phentermine 37.5 MG capsule Take 37.5 mg by mouth every morning.     valACYclovir  (VALTREX ) 1000 MG tablet Take 2 tablets (2,000 mg total) by mouth 2 (two) times daily. For one day at the onset of cold sore symptoms (Patient taking differently: Take 2,000 mg by mouth daily as needed (For one day at the onset of cold sore symptoms).) 12 tablet 1   No current facility-administered medications on file prior to visit.    Past Surgical History:  Procedure Laterality Date   CHOLECYSTECTOMY N/A 04/01/2016   Procedure: LAPAROSCOPIC CHOLECYSTECTOMY WITH INTRAOPERATIVE CHOLANGIOGRAM;  Surgeon: Debby Shipper, MD;  Location: [facility] OR;  Service: General;  Laterality: N/A;   CHOLECYSTECTOMY  05/2016   CYSTOSCOPY W/ URETEROSCOPY W/ LITHOTRIPSY     DILATION AND CURETTAGE OF UTERUS     IR RADIOLOGIST EVAL & MGMT  04/10/2021   IR RADIOLOGIST EVAL & MGMT  04/15/2022   UMBILICAL HERNIA REPAIR N/A 04/01/2016   Procedure: HERNIA REPAIR UMBILICAL ADULT;  Surgeon: Debby Shipper, MD;  Location: [facility] OR;  Service: General;  Laterality: N/A;    Allergies   Allergen Reactions   Bee Venom Anaphylaxis    There were no vitals taken for this visit.      No data to display              No data to display              Objective:  Physical Exam:  Gen: NAD, comfortable in exam room  Bilateral Hands / Wrists: No obvious deformity on inspection.  No evidence of thenar atrophy No TTP FROM bilaterally Positive Phalen's in the right wrist, negative in the left wrist Negative Tinel's bilaterally   Assessment & Plan:  1.  Bilateral carpal tunnel syndrome: Presenting today for her fourth shockwave therapy session in the setting of bilateral carpal tunnel syndrome.  Patient tolerated the procedure well.  She was counseled on nightly wrist brace use.  She will tentatively schedule her for session for 1 week.   Procedure: ECSWT Indications: Bilateral carpal tunnel syndrome   Procedure Details Consent: Risks of procedure as well as the alternatives and risks of each were explained to the patient.  Written consent for procedure obtained. Time Out: Verified patient identification, verified procedure, site was marked, verified correct patient position, medications/allergies/relevent history reviewed.  The area was cleaned with alcohol swab.     The right wrist was targeted for Extracorporeal shockwave therapy.    Preset: None Power Level: 90 mJ Frequency: 13 Hz Impulse/cycles: 1500  Head size: Large   Patient tolerated procedure well without immediate complications  The left wrist was targeted for Extracorporeal shockwave therapy.    Preset: None Power Level: 90 mJ Frequency: 13 Hz Impulse/cycles: 1500 Head size: Large   Patient tolerated procedure well without immediate complications

## 2024-01-12 ENCOUNTER — Ambulatory Visit (INDEPENDENT_AMBULATORY_CARE_PROVIDER_SITE_OTHER): Payer: Self-pay

## 2024-01-12 DIAGNOSIS — G5603 Carpal tunnel syndrome, bilateral upper limbs: Secondary | ICD-10-CM

## 2024-01-12 NOTE — Progress Notes (Addendum)
 FOLLOW-UP VISIT:  Shockwave Therapy Procedure   NAME:  Tanya Webb DOB:  Oct 09, 1975 MR#: [id number] DATE OF VISIT:  01/12/2024  Encounter:   Tanya Webb comes in for a follow up evaluation and Radial Extracorporeal Shockwave therapy (ESWT) treatment to the bilateral carpal tunnel.  Tanya Webb reports mild improvement change in symptoms.  She has been wearing nighttime braces periodically   ESWT Procedure Note: Treatment #5  Diagnosis: Bilateral carpal tunnel  Procedure Details  Consent for the procedure was obtained. Time out was performed. The anatomical landmarks and target areas for the procedure were identified.   PROCEDURE: ESWT was discussed with the patient in detail.  The risk and benefits were explained.  The point of maximal tenderness was identified and the oscillator probe was applied with moderate pressure. Treatment adjusted as mediated by patient feedback/pain.  Bilateral carpal tunnel was targeted for ESWT  Power level: 90 MJ Frequency: 13 hz Impulses: 1500 (L) 1500 (R) Head size: Large  Complications:  None; patient tolerated the procedure well.  There are no diagnoses linked to this encounter. Plan:   ESWT completed today as noted above Return in 1-2 weeks for final ESWT procedure Procedure aftercare reviewed Recommended avoiding strenuous activities and using OTC analgesia prn.  Krystal Lowing, DO Sports Medicine Fellow

## 2024-01-13 NOTE — Patient Instructions (Signed)

## 2024-02-09 ENCOUNTER — Ambulatory Visit: Payer: Self-pay

## 2024-02-10 ENCOUNTER — Encounter: Payer: Self-pay | Admitting: *Deleted

## 2024-02-10 ENCOUNTER — Ambulatory Visit: Payer: Self-pay

## 2024-02-15 ENCOUNTER — Ambulatory Visit (INDEPENDENT_AMBULATORY_CARE_PROVIDER_SITE_OTHER): Payer: Self-pay | Admitting: Sports Medicine

## 2024-02-15 VITALS — Ht 67.0 in

## 2024-02-15 DIAGNOSIS — G5603 Carpal tunnel syndrome, bilateral upper limbs: Secondary | ICD-10-CM

## 2024-02-15 NOTE — Progress Notes (Signed)
 Patient ID: Tanya Webb, female   DOB: 23-Jun-1975, 48 y.o.   MRN: [id number]  Jazarah presents today for repeat shockwave treatment for bilateral carpal tunnel syndrome.  She had a total of 5 treatments in [place].  These were helpful.  Treatment performed below for bilateral carpal tunnel syndrome.  She tolerated this quite well.  She is asking about repeat ultrasound-guided cortisone injections which have been very helpful for her in the past.  She was told by another provider that she had reached her maximum number of injections allowed.  She will follow-up with Dr. Charles in 2 weeks and will discuss this further with him.  Procedure: ECSWT Indications: Right wrist carpal tunnel syndrome   Procedure Details Consent: Risks of procedure as well as the alternatives and risks of each were explained to the patient.  Written consent for procedure obtained. Time Out: Verified patient identification, verified procedure, site was marked, verified correct patient position, medications/allergies/relevent history reviewed.  The area was cleaned with alcohol swab.     The right volar wrist was targeted for Extracorporeal shockwave therapy.    Power Level: 90 Frequency: 12 Impulse/cycles: 1500 Head size: Large   Patient tolerated procedure well without immediate complications  Procedure: ECSWT Indications: Left wrist carpal tunnel syndrome   Procedure Details Consent: Risks of procedure as well as the alternatives and risks of each were explained to the patient.  Written consent for procedure obtained. Time Out: Verified patient identification, verified procedure, site was marked, verified correct patient position, medications/allergies/relevent history reviewed.  The area was cleaned with alcohol swab.     The volar left wrist was targeted for Extracorporeal shockwave therapy.    Power Level: 90 Frequency: 12 Impulse/cycles: 1500 Head size: Large   Patient tolerated procedure well  without immediate complications     This note was dictated using Dragon naturally speaking software and may contain errors in syntax, spelling, or content which have not been identified prior to signing this note.

## 2024-02-29 ENCOUNTER — Other Ambulatory Visit: Payer: Self-pay

## 2024-02-29 ENCOUNTER — Ambulatory Visit (INDEPENDENT_AMBULATORY_CARE_PROVIDER_SITE_OTHER)

## 2024-02-29 VITALS — BP 112/72 | Ht 67.0 in | Wt 150.0 lb

## 2024-02-29 DIAGNOSIS — G5601 Carpal tunnel syndrome, right upper limb: Secondary | ICD-10-CM

## 2024-02-29 DIAGNOSIS — Q078 Other specified congenital malformations of nervous system: Secondary | ICD-10-CM | POA: Diagnosis not present

## 2024-02-29 NOTE — Addendum Note (Signed)
 Addended by: Bergen Melle A on: 02/29/2024 03:52 PM   Modules accepted: Orders

## 2024-02-29 NOTE — Progress Notes (Addendum)
   Subjective:    Patient ID: Tanya Webb, female    DOB: 48 y.o., January 12, 1976   MRN: [id number]  HPI  Chief Complaint: Carpal tunnel syndrome (bilateral)  Previously evaluated by my colleagues in [place] and has received a grand total of 5 shockwave therapy treatments which did provide some relief and she tolerated these well.  Has received multiple injections into her carpal tunnel with corticosteroid (the most recent of which was 08/17/2022.  Experiencing symptoms commonly when washing her hair Symptoms are very frequent Pain/numbness/tingling Predominantly first 3 digits but sometimes into fourth digit.  Review of pertinent testing 09/03/2020 EMG: Bilateral median neuropathy at or distal to the wrist consistent with severe carpal tunnel.  Patient expresses to me that she has a reservation for pursuing surgery due to her recent experience with an abdominal hysterectomy which resulted in a prolonged recovery and due to her work she wishes to minimize any downtime she will be experiencing as a result of surgery for carpal tunnel.    Objective:   Physical Exam Vitals:   02/29/24 1353  BP: 112/72   Bilateral wrists: Modest thenar eminence atrophy bilaterally though difficult to discern this given that her symptoms are bilateral and there is no normal side for comparison. Positive Tinel's.  Equivocal Durkan's.   Carpal Tunnel Injection with Ultrasound Guidance Procedure Note  Tanya Webb 02/02/1976 Indications: Numbness Procedure Details Following the description of risks including infection bleeding, damage to surrounding structures, patient provided verbal/written consent for right carpal tunnel injection procedure with ultrasound guidance. Patient was sterilely prepped in the usual fashion with chlorhexidine . Following topical anesthetization with ethyl chloride, sterile ultrasound technique was used to identify the median nerve. Patient was then injected with a solution of  12.5% D5 in mepivicaine 2%. This was well visualized under ultrasound, please see associated photographic documentation. Patient tolerated well without complication. Precautions provided. Cleaned and dressing applied.     Assessment & Plan:   Tanya Webb is a 48 y.o. female with past medical history significant for bilateral carpal tunnel characterized as severe based on 09/03/2020 EMG presenting with bilateral wrist pain.  After discussion of the various treatment options for carpal tunnel syndrome for her, I do feel comfortable proceeding with an injection for her given that she has had none yet this calendar year and I will use D5 as the injectate.  I also discussed with her the possibility of pursuing percutaneous carpal tunnel release and will refer her to a hand surgeon in [facility] who does this.  Follow-up with hand surgery regarding further management of bilateral carpal tunnel syndrome.  Given the presence of her Gladis Seashore anastomosis and anatomy bilaterally, consider hydrodissection along strategic points of neural pathways in the forearm to optimize treatment response.

## 2024-03-07 ENCOUNTER — Other Ambulatory Visit: Payer: Self-pay

## 2024-03-07 ENCOUNTER — Ambulatory Visit

## 2024-03-07 VITALS — BP 120/72 | Ht 67.0 in | Wt 150.0 lb

## 2024-03-07 DIAGNOSIS — G5602 Carpal tunnel syndrome, left upper limb: Secondary | ICD-10-CM | POA: Diagnosis not present

## 2024-03-07 DIAGNOSIS — Q078 Other specified congenital malformations of nervous system: Secondary | ICD-10-CM

## 2024-03-07 NOTE — Progress Notes (Signed)
   Subjective:    Patient ID: Tanya Webb, female    DOB: 48 y.o., [birth year]   MRN: [id number]  Chief Complaint: Left-sided carpal tunnel  Discussed the use of AI scribe software for clinical note transcription with the patient, who gave verbal consent to proceed.  Patient previously diagnosed with bilateral carpal tunnel syndrome confirmed on nerve conduction study in addition to Martin-Gruber anastomosis.  Received D5 hydrodissection of the median nerve on 02/29/2024 by myself.  Patient today presents for follow-up and planned injection of the contralateral side.  Patient reports significant relief of right sided carpal tunnel after injection by myself on 02/29/2024.  She received a phone call and has a scheduled follow-up visit with hand surgeon to discuss percutaneous carpal tunnel release.    Objective:   Vitals:   03/07/24 1538  BP: 120/72   Carpal Tunnel Injection with Ultrasound Guidance Procedure Note  Tanya Webb 02/15/1976 Indications: Numbness Procedure Details Following the description of risks including infection bleeding, damage to surrounding structures, patient provided verbal/written consent for left carpal tunnel injection procedure with ultrasound guidance. Patient was sterilely prepped in the usual fashion with chlorhexidine . Following topical anesthetization with ethyl chloride, sterile ultrasound technique was used to identify the median nerve. Patient was then injected with a solution of D5 diluted to a concentration of 25% with 2% Mepivicaine, and 0.5cc of 8.4% Sodium Bicarbonate. This was well visualized under ultrasound, please see associated photographic documentation. Patient tolerated well without complication. Precautions provided. Cleaned and dressing applied.     Assessment & Plan:  48 year old female presenting for carpal tunnel follow-up.  Successful carpal tunnel injection today on left wrist.  Excellent relief from right wrist after D5 injection  on 02/29/2024.  Recommend keep appointment with hand surgery and let me know I can be of assistance in the future.

## 2024-03-20 ENCOUNTER — Ambulatory Visit: Admitting: Orthopedic Surgery

## 2024-03-20 DIAGNOSIS — G5603 Carpal tunnel syndrome, bilateral upper limbs: Secondary | ICD-10-CM | POA: Diagnosis not present

## 2024-03-20 NOTE — Progress Notes (Signed)
 Tanya Webb - 48 y.o. female MRN [id number]  Date of birth: December 24, 1975  Office Visit Note: Visit Date: 03/20/2024 PCP: Justino Lonney Carte, FNP Referred by: Charles Redell LABOR, DO  Subjective: No chief complaint on file.  HPI: Tanya Webb is a pleasant 48 y.o. female who presents today for bilateral hand numbness and tingling of the radial sided digits. States she has had this for several years and has been getting periodic injections which have helped up until recently.  She has also trialed previous bracing and shockwave therapy without lasting relief.  Symptoms are present at the bilateral hands, right greater than left.  She is here today to discuss surgery options.  Pertinent ROS were reviewed with the patient and found to be negative unless otherwise specified above in HPI.   Visit Reason: Bilateral carpal tunnel syndrome, right greater than left Duration of symptoms: years Hand dominance: right Occupation: Hydrologist Diabetic: No Smoking: No Heart/Lung History: none Blood Thinners: none  Prior Testing/EMG: EMG 2022 Injections (Date): 02/29/24- right, 03/07/24-left Treatments: injection, shockwave, brace Prior Surgery: none    Assessment & Plan: Visit Diagnoses:  1. Bilateral carpal tunnel syndrome     Plan: Extensive discussion was had with the patient today about her ongoing bilateral carpal tunnel syndrome that is refractory to conservative care.  Patient has both clinical and electrodiagnostic evidence to confirm this diagnosis.  At this juncture, she is indicated for bilateral, staged open versus endoscopic carpal tunnel release.  Risks and benefits of both operations were discussed in detail today.  She would like to begin with the right side.  She will like to consider her options with open versus endoscopic and will contact our office in the near future to move forward surgical scheduling.  I did explain that we  generally wait at least 6 weeks from  her most recent injection to move forward with surgical intervention to limit postoperative complication, she expressed understanding.  Risks include but not limited to infection, bleeding, scarring, stiffness, nerve injury or vascular, tendon injury, risk of recurrence and need for subsequent operation were all discussed in detail.  Patient consented understanding the above.    Follow-up: No follow-ups on file.   Meds & Orders: No orders of the defined types were placed in this encounter.  No orders of the defined types were placed in this encounter.    Procedures: No procedures performed      Clinical History: No specialty comments available.  She reports that she has never smoked. She has never used smokeless tobacco. No results for input(s): HGBA1C, LABURIC in the last 8760 hours.  Objective:   Vital Signs: There were no vitals taken for this visit.  Physical Exam  Gen: Well-appearing, in no acute distress; non-toxic CV: Regular Rate. Well-perfused. Warm.  Resp: Breathing unlabored on room air; no wheezing. Psych: Fluid speech in conversation; appropriate affect; normal thought process  Ortho Exam PHYSICAL EXAM:  General: Patient is well appearing and in no distress.   Skin and Muscle: No significant skin changes are apparent to upper extremities.   Range of Motion and Palpation Tests: Mobility is full about the elbows with flexion and extension. Forearm supination and pronation are 85/85 bilaterally.  Wrist flexion/extension is 75/65 bilaterally.  Digital flexion and extension are full.  Thumb opposition is full to the base of the small fingers bilaterally.    No cords or nodules are palpated.  No triggering is observed.    Neurologic, Vascular, Motor: Sensation  is diminished to light touch in the bilateral median nerve distribution.    Thenar atrophy: Negative bilateral Tinel sign: Positive bilateral carpal tunnel Carpal tunnel compression: Positive  bilateral Phalen test: Positive bilateral  Motor bilateral hand FPL: 5/5 Index FDP: 5/5 APB: 5/5  Fingers pink and well perfused.  Capillary refill is brisk.     Lab Results  Component Value Date   HGBA1C 5.1 03/20/2020      Imaging: No results found.  Past Medical/Family/Surgical/Social History: Medications & Allergies reviewed per EMR, new medications updated. Patient Active Problem List   Diagnosis Date Noted   Bilateral carpal tunnel syndrome 03/20/2020   Fibroid uterus 03/04/2017   Hydronephrosis due to obstruction of ureter 03/04/2016   Herpes simplex virus (HSV) infection 12/10/2009   MENORRHAGIA 08/22/2008   Anxiety state 07/29/2006   TACHYCARDIA 07/29/2006   Past Medical History:  Diagnosis Date   Abdominal pain    Anxiety    History of kidney stones    Family History  Problem Relation Age of Onset   Diabetes Father    Heart disease Father    Diabetes Brother    Past Surgical History:  Procedure Laterality Date   CHOLECYSTECTOMY N/A 04/01/2016   Procedure: LAPAROSCOPIC CHOLECYSTECTOMY WITH INTRAOPERATIVE CHOLANGIOGRAM;  Surgeon: Debby Shipper, MD;  Location: [facility] OR;  Service: General;  Laterality: N/A;   CHOLECYSTECTOMY  05/2016   CYSTOSCOPY W/ URETEROSCOPY W/ LITHOTRIPSY     DILATION AND CURETTAGE OF UTERUS     IR RADIOLOGIST EVAL & MGMT  04/10/2021   IR RADIOLOGIST EVAL & MGMT  04/15/2022   UMBILICAL HERNIA REPAIR N/A 04/01/2016   Procedure: HERNIA REPAIR UMBILICAL ADULT;  Surgeon: Debby Shipper, MD;  Location: [facility] OR;  Service: General;  Laterality: N/A;   Social History   Occupational History   Not on file  Tobacco Use   Smoking status: Never   Smokeless tobacco: Never  Substance and Sexual Activity   Alcohol use: Yes    Comment: weekends- sometimes   Drug use: No   Sexual activity: Yes    Birth control/protection: None, Surgical    Skai Lickteig Afton Alderton, M.D. [facility], Hand Surgery

## 2024-04-03 ENCOUNTER — Encounter: Payer: Self-pay | Admitting: Radiology
